# Patient Record
Sex: Female | Born: 1940 | Race: White | Hispanic: No | Smoking: Former smoker
Health system: Southern US, Community
[De-identification: ages and names within clinical notes are randomized; demographics above are authoritative.]

## PROBLEM LIST (undated history)

## (undated) DIAGNOSIS — C801 Malignant (primary) neoplasm, unspecified: Secondary | ICD-10-CM

## (undated) DIAGNOSIS — F329 Major depressive disorder, single episode, unspecified: Secondary | ICD-10-CM

## (undated) DIAGNOSIS — F32A Depression, unspecified: Secondary | ICD-10-CM

## (undated) DIAGNOSIS — M1990 Unspecified osteoarthritis, unspecified site: Secondary | ICD-10-CM

## (undated) DIAGNOSIS — R0600 Dyspnea, unspecified: Secondary | ICD-10-CM

## (undated) DIAGNOSIS — J449 Chronic obstructive pulmonary disease, unspecified: Secondary | ICD-10-CM

## (undated) DIAGNOSIS — G8929 Other chronic pain: Secondary | ICD-10-CM

## (undated) DIAGNOSIS — M549 Dorsalgia, unspecified: Secondary | ICD-10-CM

## (undated) HISTORY — PX: TONSILLECTOMY: SUR1361

## (undated) HISTORY — PX: LUNG CANCER SURGERY: SHX702

## (undated) HISTORY — PX: CARPAL TUNNEL RELEASE: SHX101

---

## 2000-03-05 ENCOUNTER — Emergency Department (HOSPITAL_COMMUNITY): Admission: EM | Admit: 2000-03-05 | Discharge: 2000-03-05 | Payer: Self-pay

## 2001-01-24 ENCOUNTER — Ambulatory Visit (HOSPITAL_COMMUNITY): Admission: RE | Admit: 2001-01-24 | Discharge: 2001-01-24 | Payer: Self-pay | Admitting: *Deleted

## 2001-01-24 ENCOUNTER — Encounter: Payer: Self-pay | Admitting: Internal Medicine

## 2001-02-11 ENCOUNTER — Ambulatory Visit (HOSPITAL_COMMUNITY): Admission: RE | Admit: 2001-02-11 | Discharge: 2001-02-11 | Payer: Self-pay | Admitting: Internal Medicine

## 2001-02-11 ENCOUNTER — Other Ambulatory Visit: Admission: RE | Admit: 2001-02-11 | Discharge: 2001-02-11 | Payer: Self-pay | Admitting: Internal Medicine

## 2001-02-11 ENCOUNTER — Encounter: Payer: Self-pay | Admitting: Internal Medicine

## 2001-05-14 ENCOUNTER — Encounter (INDEPENDENT_AMBULATORY_CARE_PROVIDER_SITE_OTHER): Payer: Self-pay | Admitting: Specialist

## 2001-05-14 ENCOUNTER — Ambulatory Visit (HOSPITAL_COMMUNITY): Admission: RE | Admit: 2001-05-14 | Discharge: 2001-05-14 | Payer: Self-pay | Admitting: *Deleted

## 2002-03-25 ENCOUNTER — Ambulatory Visit (HOSPITAL_COMMUNITY): Admission: RE | Admit: 2002-03-25 | Discharge: 2002-03-25 | Payer: Self-pay | Admitting: Internal Medicine

## 2002-03-25 ENCOUNTER — Encounter: Payer: Self-pay | Admitting: Internal Medicine

## 2002-12-04 ENCOUNTER — Encounter (INDEPENDENT_AMBULATORY_CARE_PROVIDER_SITE_OTHER): Payer: Self-pay | Admitting: *Deleted

## 2002-12-04 ENCOUNTER — Ambulatory Visit (HOSPITAL_COMMUNITY): Admission: RE | Admit: 2002-12-04 | Discharge: 2002-12-04 | Payer: Self-pay | Admitting: *Deleted

## 2003-03-13 ENCOUNTER — Emergency Department (HOSPITAL_COMMUNITY): Admission: EM | Admit: 2003-03-13 | Discharge: 2003-03-13 | Payer: Self-pay | Admitting: Emergency Medicine

## 2003-03-13 ENCOUNTER — Encounter: Payer: Self-pay | Admitting: Emergency Medicine

## 2003-03-29 ENCOUNTER — Ambulatory Visit (HOSPITAL_COMMUNITY): Admission: RE | Admit: 2003-03-29 | Discharge: 2003-03-29 | Payer: Self-pay | Admitting: Internal Medicine

## 2003-03-29 ENCOUNTER — Encounter: Payer: Self-pay | Admitting: Internal Medicine

## 2003-10-11 ENCOUNTER — Ambulatory Visit (HOSPITAL_COMMUNITY): Admission: RE | Admit: 2003-10-11 | Discharge: 2003-10-11 | Payer: Self-pay | Admitting: *Deleted

## 2004-03-30 ENCOUNTER — Ambulatory Visit (HOSPITAL_COMMUNITY): Admission: RE | Admit: 2004-03-30 | Discharge: 2004-03-30 | Payer: Self-pay | Admitting: Internal Medicine

## 2004-04-06 ENCOUNTER — Encounter: Admission: RE | Admit: 2004-04-06 | Discharge: 2004-04-06 | Payer: Self-pay | Admitting: Internal Medicine

## 2005-05-02 ENCOUNTER — Encounter: Admission: RE | Admit: 2005-05-02 | Discharge: 2005-05-02 | Payer: Self-pay | Admitting: Internal Medicine

## 2005-05-15 ENCOUNTER — Encounter: Admission: RE | Admit: 2005-05-15 | Discharge: 2005-05-15 | Payer: Self-pay | Admitting: Internal Medicine

## 2005-08-15 ENCOUNTER — Encounter: Admission: RE | Admit: 2005-08-15 | Discharge: 2005-08-15 | Payer: Self-pay | Admitting: Internal Medicine

## 2005-08-31 ENCOUNTER — Encounter: Admission: RE | Admit: 2005-08-31 | Discharge: 2005-08-31 | Payer: Self-pay | Admitting: Internal Medicine

## 2005-10-05 ENCOUNTER — Encounter: Admission: RE | Admit: 2005-10-05 | Discharge: 2005-10-05 | Payer: Self-pay | Admitting: Surgery

## 2005-10-08 ENCOUNTER — Encounter: Admission: RE | Admit: 2005-10-08 | Discharge: 2005-10-08 | Payer: Self-pay | Admitting: Surgery

## 2005-10-17 ENCOUNTER — Ambulatory Visit: Admission: RE | Admit: 2005-10-17 | Discharge: 2005-10-17 | Payer: Self-pay | Admitting: Thoracic Surgery

## 2005-10-17 ENCOUNTER — Ambulatory Visit (HOSPITAL_COMMUNITY): Admission: RE | Admit: 2005-10-17 | Discharge: 2005-10-17 | Payer: Self-pay | Admitting: Thoracic Surgery

## 2005-10-19 ENCOUNTER — Encounter (INDEPENDENT_AMBULATORY_CARE_PROVIDER_SITE_OTHER): Payer: Self-pay | Admitting: *Deleted

## 2005-10-19 ENCOUNTER — Inpatient Hospital Stay (HOSPITAL_COMMUNITY): Admission: RE | Admit: 2005-10-19 | Discharge: 2005-10-23 | Payer: Self-pay | Admitting: Thoracic Surgery

## 2005-10-28 ENCOUNTER — Emergency Department (HOSPITAL_COMMUNITY): Admission: EM | Admit: 2005-10-28 | Discharge: 2005-10-28 | Payer: Self-pay | Admitting: Emergency Medicine

## 2005-10-31 ENCOUNTER — Encounter: Admission: RE | Admit: 2005-10-31 | Discharge: 2005-10-31 | Payer: Self-pay | Admitting: Thoracic Surgery

## 2005-10-31 ENCOUNTER — Ambulatory Visit: Payer: Self-pay | Admitting: Internal Medicine

## 2005-11-14 ENCOUNTER — Encounter: Admission: RE | Admit: 2005-11-14 | Discharge: 2005-11-14 | Payer: Self-pay | Admitting: Thoracic Surgery

## 2005-11-29 ENCOUNTER — Encounter (INDEPENDENT_AMBULATORY_CARE_PROVIDER_SITE_OTHER): Payer: Self-pay | Admitting: Specialist

## 2005-11-29 ENCOUNTER — Ambulatory Visit (HOSPITAL_BASED_OUTPATIENT_CLINIC_OR_DEPARTMENT_OTHER): Admission: RE | Admit: 2005-11-29 | Discharge: 2005-11-29 | Payer: Self-pay | Admitting: Surgery

## 2005-12-26 ENCOUNTER — Encounter: Admission: RE | Admit: 2005-12-26 | Discharge: 2005-12-26 | Payer: Self-pay | Admitting: Thoracic Surgery

## 2006-02-21 ENCOUNTER — Ambulatory Visit: Payer: Self-pay | Admitting: Internal Medicine

## 2006-02-25 LAB — CBC WITH DIFFERENTIAL/PLATELET
Basophils Absolute: 0 10*3/uL (ref 0.0–0.1)
EOS%: 1.5 % (ref 0.0–7.0)
HCT: 39 % (ref 34.8–46.6)
HGB: 13.2 g/dL (ref 11.6–15.9)
LYMPH%: 21.7 % (ref 14.0–48.0)
MCH: 32.7 pg (ref 26.0–34.0)
MCV: 96.4 fL (ref 81.0–101.0)
MONO%: 7.6 % (ref 0.0–13.0)
NEUT%: 68.9 % (ref 39.6–76.8)
Platelets: 287 10*3/uL (ref 145–400)

## 2006-02-25 LAB — COMPREHENSIVE METABOLIC PANEL
AST: 23 U/L (ref 0–37)
Alkaline Phosphatase: 49 U/L (ref 39–117)
BUN: 14 mg/dL (ref 6–23)
Creatinine, Ser: 1.06 mg/dL (ref 0.40–1.20)
Glucose, Bld: 114 mg/dL — ABNORMAL HIGH (ref 70–99)

## 2006-02-27 ENCOUNTER — Ambulatory Visit (HOSPITAL_COMMUNITY): Admission: RE | Admit: 2006-02-27 | Discharge: 2006-02-27 | Payer: Self-pay | Admitting: Internal Medicine

## 2006-03-27 ENCOUNTER — Encounter: Admission: RE | Admit: 2006-03-27 | Discharge: 2006-03-27 | Payer: Self-pay | Admitting: Thoracic Surgery

## 2006-05-03 ENCOUNTER — Encounter: Admission: RE | Admit: 2006-05-03 | Discharge: 2006-05-03 | Payer: Self-pay | Admitting: Internal Medicine

## 2006-05-20 ENCOUNTER — Ambulatory Visit: Payer: Self-pay | Admitting: Internal Medicine

## 2006-05-20 LAB — CBC WITH DIFFERENTIAL/PLATELET
Basophils Absolute: 0 10*3/uL (ref 0.0–0.1)
EOS%: 1.4 % (ref 0.0–7.0)
HGB: 13.8 g/dL (ref 11.6–15.9)
LYMPH%: 16.3 % (ref 14.0–48.0)
MCH: 33.4 pg (ref 26.0–34.0)
MCV: 97.6 fL (ref 81.0–101.0)
MONO%: 8 % (ref 0.0–13.0)
RBC: 4.14 10*6/uL (ref 3.70–5.32)
RDW: 13.9 % (ref 11.3–14.5)

## 2006-05-20 LAB — COMPREHENSIVE METABOLIC PANEL
AST: 31 U/L (ref 0–37)
Albumin: 4.5 g/dL (ref 3.5–5.2)
Alkaline Phosphatase: 55 U/L (ref 39–117)
BUN: 12 mg/dL (ref 6–23)
Potassium: 3.5 mEq/L (ref 3.5–5.3)
Total Bilirubin: 1.7 mg/dL — ABNORMAL HIGH (ref 0.3–1.2)

## 2006-06-28 ENCOUNTER — Ambulatory Visit (HOSPITAL_COMMUNITY): Admission: RE | Admit: 2006-06-28 | Discharge: 2006-06-28 | Payer: Self-pay | Admitting: Internal Medicine

## 2006-08-28 ENCOUNTER — Encounter: Admission: RE | Admit: 2006-08-28 | Discharge: 2006-08-28 | Payer: Self-pay | Admitting: Thoracic Surgery

## 2006-12-18 ENCOUNTER — Ambulatory Visit: Payer: Self-pay | Admitting: Internal Medicine

## 2006-12-23 LAB — CBC WITH DIFFERENTIAL/PLATELET
Basophils Absolute: 0 10*3/uL (ref 0.0–0.1)
EOS%: 3 % (ref 0.0–7.0)
Eosinophils Absolute: 0.2 10*3/uL (ref 0.0–0.5)
HCT: 38.5 % (ref 34.8–46.6)
HGB: 13.3 g/dL (ref 11.6–15.9)
MCH: 33.3 pg (ref 26.0–34.0)
MCV: 96.5 fL (ref 81.0–101.0)
MONO%: 8.7 % (ref 0.0–13.0)
NEUT#: 3.3 10*3/uL (ref 1.5–6.5)
NEUT%: 59.6 % (ref 39.6–76.8)
lymph#: 1.6 10*3/uL (ref 0.9–3.3)

## 2006-12-23 LAB — COMPREHENSIVE METABOLIC PANEL
ALT: 17 U/L (ref 0–35)
Albumin: 4.1 g/dL (ref 3.5–5.2)
Alkaline Phosphatase: 47 U/L (ref 39–117)
CO2: 34 mEq/L — ABNORMAL HIGH (ref 19–32)
Glucose, Bld: 88 mg/dL (ref 70–99)
Potassium: 3.7 mEq/L (ref 3.5–5.3)
Sodium: 142 mEq/L (ref 135–145)
Total Protein: 6.8 g/dL (ref 6.0–8.3)

## 2006-12-27 ENCOUNTER — Ambulatory Visit (HOSPITAL_COMMUNITY): Admission: RE | Admit: 2006-12-27 | Discharge: 2006-12-27 | Payer: Self-pay | Admitting: Internal Medicine

## 2006-12-30 LAB — BASIC METABOLIC PANEL
BUN: 16 mg/dL (ref 6–23)
Calcium: 11.7 mg/dL — ABNORMAL HIGH (ref 8.4–10.5)
Glucose, Bld: 142 mg/dL — ABNORMAL HIGH (ref 70–99)
Potassium: 2.7 mEq/L — CL (ref 3.5–5.3)

## 2007-01-06 LAB — COMPREHENSIVE METABOLIC PANEL
ALT: 29 U/L (ref 0–35)
AST: 33 U/L (ref 0–37)
Albumin: 3.7 g/dL (ref 3.5–5.2)
Alkaline Phosphatase: 48 U/L (ref 39–117)
Glucose, Bld: 94 mg/dL (ref 70–99)
Potassium: 3.4 mEq/L — ABNORMAL LOW (ref 3.5–5.3)
Sodium: 140 mEq/L (ref 135–145)
Total Bilirubin: 1.2 mg/dL (ref 0.3–1.2)
Total Protein: 6.9 g/dL (ref 6.0–8.3)

## 2007-01-08 ENCOUNTER — Ambulatory Visit: Payer: Self-pay | Admitting: Thoracic Surgery

## 2007-05-09 ENCOUNTER — Encounter: Admission: RE | Admit: 2007-05-09 | Discharge: 2007-05-09 | Payer: Self-pay | Admitting: Internal Medicine

## 2007-06-19 ENCOUNTER — Ambulatory Visit: Payer: Self-pay | Admitting: Internal Medicine

## 2007-06-23 LAB — CBC WITH DIFFERENTIAL/PLATELET
BASO%: 1.3 % (ref 0.0–2.0)
EOS%: 4.4 % (ref 0.0–7.0)
LYMPH%: 30.5 % (ref 14.0–48.0)
MCH: 33.5 pg (ref 26.0–34.0)
MCHC: 35.7 g/dL (ref 32.0–36.0)
MCV: 93.8 fL (ref 81.0–101.0)
MONO%: 8.5 % (ref 0.0–13.0)
NEUT#: 2.7 10*3/uL (ref 1.5–6.5)
Platelets: 235 10*3/uL (ref 145–400)
RBC: 4.09 10*6/uL (ref 3.70–5.32)
RDW: 11.8 % (ref 11.3–14.5)

## 2007-06-23 LAB — COMPREHENSIVE METABOLIC PANEL
AST: 29 U/L (ref 0–37)
Albumin: 4.6 g/dL (ref 3.5–5.2)
Alkaline Phosphatase: 46 U/L (ref 39–117)
Glucose, Bld: 78 mg/dL (ref 70–99)
Potassium: 3.4 mEq/L — ABNORMAL LOW (ref 3.5–5.3)
Sodium: 144 mEq/L (ref 135–145)
Total Bilirubin: 1.1 mg/dL (ref 0.3–1.2)
Total Protein: 7.4 g/dL (ref 6.0–8.3)

## 2007-06-27 ENCOUNTER — Ambulatory Visit (HOSPITAL_COMMUNITY): Admission: RE | Admit: 2007-06-27 | Discharge: 2007-06-27 | Payer: Self-pay | Admitting: Internal Medicine

## 2007-12-18 ENCOUNTER — Ambulatory Visit: Payer: Self-pay | Admitting: Internal Medicine

## 2007-12-23 LAB — COMPREHENSIVE METABOLIC PANEL
ALT: 23 U/L (ref 0–35)
AST: 30 U/L (ref 0–37)
CO2: 28 mEq/L (ref 19–32)
Calcium: 9.2 mg/dL (ref 8.4–10.5)
Chloride: 99 mEq/L (ref 96–112)
Creatinine, Ser: 1.2 mg/dL (ref 0.40–1.20)
Sodium: 139 mEq/L (ref 135–145)
Total Protein: 7.4 g/dL (ref 6.0–8.3)

## 2007-12-23 LAB — CBC WITH DIFFERENTIAL/PLATELET
BASO%: 0.4 % (ref 0.0–2.0)
EOS%: 1.9 % (ref 0.0–7.0)
MCH: 32.4 pg (ref 26.0–34.0)
MCHC: 34.4 g/dL (ref 32.0–36.0)
MONO#: 0.4 10*3/uL (ref 0.1–0.9)
NEUT%: 65.5 % (ref 39.6–76.8)
RBC: 4.23 10*6/uL (ref 3.70–5.32)
RDW: 16.6 % — ABNORMAL HIGH (ref 11.3–14.5)
WBC: 6 10*3/uL (ref 3.9–10.0)
lymph#: 1.5 10*3/uL (ref 0.9–3.3)

## 2007-12-26 ENCOUNTER — Ambulatory Visit (HOSPITAL_COMMUNITY): Admission: RE | Admit: 2007-12-26 | Discharge: 2007-12-26 | Payer: Self-pay | Admitting: Internal Medicine

## 2008-05-14 ENCOUNTER — Encounter: Admission: RE | Admit: 2008-05-14 | Discharge: 2008-05-14 | Payer: Self-pay | Admitting: Internal Medicine

## 2008-06-21 ENCOUNTER — Ambulatory Visit: Payer: Self-pay | Admitting: Internal Medicine

## 2008-06-23 LAB — COMPREHENSIVE METABOLIC PANEL
ALT: 46 U/L — ABNORMAL HIGH (ref 0–35)
AST: 74 U/L — ABNORMAL HIGH (ref 0–37)
Albumin: 4.6 g/dL (ref 3.5–5.2)
CO2: 25 mEq/L (ref 19–32)
Calcium: 9.2 mg/dL (ref 8.4–10.5)
Chloride: 96 mEq/L (ref 96–112)
Creatinine, Ser: 1.18 mg/dL (ref 0.40–1.20)
Potassium: 3.6 mEq/L (ref 3.5–5.3)
Total Protein: 7.5 g/dL (ref 6.0–8.3)

## 2008-06-23 LAB — CBC WITH DIFFERENTIAL/PLATELET
BASO%: 0.2 % (ref 0.0–2.0)
EOS%: 1.2 % (ref 0.0–7.0)
HCT: 43.4 % (ref 34.8–46.6)
HGB: 14.9 g/dL (ref 11.6–15.9)
MCHC: 34.4 g/dL (ref 32.0–36.0)
MONO#: 0.3 10*3/uL (ref 0.1–0.9)
NEUT%: 75.4 % (ref 39.6–76.8)
RDW: 15.2 % — ABNORMAL HIGH (ref 11.3–14.5)
WBC: 6.3 10*3/uL (ref 3.9–10.0)
lymph#: 1.1 10*3/uL (ref 0.9–3.3)

## 2008-06-25 ENCOUNTER — Ambulatory Visit (HOSPITAL_COMMUNITY): Admission: RE | Admit: 2008-06-25 | Discharge: 2008-06-25 | Payer: Self-pay | Admitting: Internal Medicine

## 2008-07-06 ENCOUNTER — Ambulatory Visit (HOSPITAL_COMMUNITY): Admission: RE | Admit: 2008-07-06 | Discharge: 2008-07-06 | Payer: Self-pay | Admitting: Neurosurgery

## 2008-07-22 ENCOUNTER — Ambulatory Visit (HOSPITAL_COMMUNITY): Admission: RE | Admit: 2008-07-22 | Discharge: 2008-07-22 | Payer: Self-pay | Admitting: *Deleted

## 2008-08-12 ENCOUNTER — Encounter: Admission: RE | Admit: 2008-08-12 | Discharge: 2008-08-12 | Payer: Self-pay | Admitting: Internal Medicine

## 2008-08-27 ENCOUNTER — Encounter: Admission: RE | Admit: 2008-08-27 | Discharge: 2008-08-27 | Payer: Self-pay | Admitting: Internal Medicine

## 2008-10-21 ENCOUNTER — Inpatient Hospital Stay (HOSPITAL_COMMUNITY): Admission: RE | Admit: 2008-10-21 | Discharge: 2008-10-26 | Payer: Self-pay | Admitting: Neurosurgery

## 2008-12-29 ENCOUNTER — Ambulatory Visit: Payer: Self-pay | Admitting: Internal Medicine

## 2008-12-31 ENCOUNTER — Ambulatory Visit (HOSPITAL_COMMUNITY): Admission: RE | Admit: 2008-12-31 | Discharge: 2008-12-31 | Payer: Self-pay | Admitting: Internal Medicine

## 2008-12-31 LAB — CBC WITH DIFFERENTIAL/PLATELET
Basophils Absolute: 0 10*3/uL (ref 0.0–0.1)
HCT: 40 % (ref 34.8–46.6)
HGB: 13.5 g/dL (ref 11.6–15.9)
MONO#: 0.3 10*3/uL (ref 0.1–0.9)
NEUT#: 1.4 10*3/uL — ABNORMAL LOW (ref 1.5–6.5)
NEUT%: 45.8 % (ref 38.4–76.8)
WBC: 3 10*3/uL — ABNORMAL LOW (ref 3.9–10.3)
lymph#: 1.1 10*3/uL (ref 0.9–3.3)

## 2008-12-31 LAB — COMPREHENSIVE METABOLIC PANEL
ALT: 21 U/L (ref 0–35)
Albumin: 3.7 g/dL (ref 3.5–5.2)
BUN: 8 mg/dL (ref 6–23)
CO2: 31 mEq/L (ref 19–32)
Calcium: 9.4 mg/dL (ref 8.4–10.5)
Chloride: 100 mEq/L (ref 96–112)
Creatinine, Ser: 0.76 mg/dL (ref 0.40–1.20)
Potassium: 3.2 mEq/L — ABNORMAL LOW (ref 3.5–5.3)

## 2009-05-18 ENCOUNTER — Encounter: Admission: RE | Admit: 2009-05-18 | Discharge: 2009-05-18 | Payer: Self-pay | Admitting: Internal Medicine

## 2009-06-29 ENCOUNTER — Ambulatory Visit: Payer: Self-pay | Admitting: Internal Medicine

## 2009-07-01 ENCOUNTER — Ambulatory Visit (HOSPITAL_COMMUNITY): Admission: RE | Admit: 2009-07-01 | Discharge: 2009-07-01 | Payer: Self-pay | Admitting: Internal Medicine

## 2009-07-01 ENCOUNTER — Encounter: Admission: RE | Admit: 2009-07-01 | Discharge: 2009-08-01 | Payer: Self-pay | Admitting: Internal Medicine

## 2009-07-01 LAB — CBC WITH DIFFERENTIAL/PLATELET
Basophils Absolute: 0 10*3/uL (ref 0.0–0.1)
EOS%: 2.9 % (ref 0.0–7.0)
Eosinophils Absolute: 0.1 10*3/uL (ref 0.0–0.5)
HCT: 43.2 % (ref 34.8–46.6)
HGB: 14.5 g/dL (ref 11.6–15.9)
MCH: 35.4 pg — ABNORMAL HIGH (ref 25.1–34.0)
MONO#: 0.4 10*3/uL (ref 0.1–0.9)
NEUT%: 59.9 % (ref 38.4–76.8)
lymph#: 1 10*3/uL (ref 0.9–3.3)

## 2009-07-01 LAB — COMPREHENSIVE METABOLIC PANEL
BUN: 6 mg/dL (ref 6–23)
CO2: 31 mEq/L (ref 19–32)
Calcium: 9.5 mg/dL (ref 8.4–10.5)
Chloride: 97 mEq/L (ref 96–112)
Creatinine, Ser: 0.9 mg/dL (ref 0.40–1.20)
Glucose, Bld: 92 mg/dL (ref 70–99)

## 2009-07-29 ENCOUNTER — Encounter (INDEPENDENT_AMBULATORY_CARE_PROVIDER_SITE_OTHER): Payer: Self-pay | Admitting: *Deleted

## 2009-12-28 ENCOUNTER — Other Ambulatory Visit: Admission: RE | Admit: 2009-12-28 | Discharge: 2009-12-28 | Payer: Self-pay | Admitting: Internal Medicine

## 2009-12-29 ENCOUNTER — Ambulatory Visit: Payer: Self-pay | Admitting: Internal Medicine

## 2009-12-30 ENCOUNTER — Ambulatory Visit (HOSPITAL_COMMUNITY): Admission: RE | Admit: 2009-12-30 | Discharge: 2009-12-30 | Payer: Self-pay | Admitting: Internal Medicine

## 2009-12-30 LAB — COMPREHENSIVE METABOLIC PANEL
ALT: 21 U/L (ref 0–35)
Alkaline Phosphatase: 50 U/L (ref 39–117)
BUN: 8 mg/dL (ref 6–23)
CO2: 31 mEq/L (ref 19–32)
Glucose, Bld: 82 mg/dL (ref 70–99)
Sodium: 140 mEq/L (ref 135–145)

## 2009-12-30 LAB — CBC WITH DIFFERENTIAL/PLATELET
BASO%: 1.1 % (ref 0.0–2.0)
Basophils Absolute: 0 10*3/uL (ref 0.0–0.1)
EOS%: 3.8 % (ref 0.0–7.0)
Eosinophils Absolute: 0.1 10*3/uL (ref 0.0–0.5)
HGB: 13.4 g/dL (ref 11.6–15.9)
MCH: 33.6 pg (ref 25.1–34.0)
MCHC: 34 g/dL (ref 31.5–36.0)
MCV: 98.8 fL (ref 79.5–101.0)
NEUT#: 1.7 10*3/uL (ref 1.5–6.5)
Platelets: 244 10*3/uL (ref 145–400)

## 2010-05-19 ENCOUNTER — Encounter: Admission: RE | Admit: 2010-05-19 | Discharge: 2010-05-19 | Payer: Self-pay | Admitting: Internal Medicine

## 2010-06-26 ENCOUNTER — Ambulatory Visit: Payer: Self-pay | Admitting: Internal Medicine

## 2010-06-28 ENCOUNTER — Ambulatory Visit (HOSPITAL_COMMUNITY): Admission: RE | Admit: 2010-06-28 | Discharge: 2010-06-28 | Payer: Self-pay | Admitting: Internal Medicine

## 2010-06-28 LAB — CBC WITH DIFFERENTIAL/PLATELET
EOS%: 5.1 % (ref 0.0–7.0)
LYMPH%: 30.7 % (ref 14.0–49.7)
NEUT#: 1.9 10*3/uL (ref 1.5–6.5)
NEUT%: 51.4 % (ref 38.4–76.8)
Platelets: 236 10*3/uL (ref 145–400)
RBC: 4.11 10*6/uL (ref 3.70–5.45)
RDW: 14.9 % — ABNORMAL HIGH (ref 11.2–14.5)
lymph#: 1.1 10*3/uL (ref 0.9–3.3)

## 2010-06-28 LAB — COMPREHENSIVE METABOLIC PANEL
AST: 60 U/L — ABNORMAL HIGH (ref 0–37)
CO2: 30 mEq/L (ref 19–32)
Chloride: 99 mEq/L (ref 96–112)
Potassium: 3.6 mEq/L (ref 3.5–5.3)
Sodium: 140 mEq/L (ref 135–145)
Total Protein: 7.4 g/dL (ref 6.0–8.3)

## 2010-09-29 ENCOUNTER — Other Ambulatory Visit: Payer: Self-pay | Admitting: Internal Medicine

## 2010-09-29 DIAGNOSIS — C3490 Malignant neoplasm of unspecified part of unspecified bronchus or lung: Secondary | ICD-10-CM

## 2010-10-01 ENCOUNTER — Encounter: Payer: Self-pay | Admitting: Internal Medicine

## 2010-12-26 LAB — BASIC METABOLIC PANEL
CO2: 27 mEq/L (ref 19–32)
CO2: 31 mEq/L (ref 19–32)
Chloride: 95 mEq/L — ABNORMAL LOW (ref 96–112)
Chloride: 95 mEq/L — ABNORMAL LOW (ref 96–112)
Chloride: 98 mEq/L (ref 96–112)
Creatinine, Ser: 0.75 mg/dL (ref 0.4–1.2)
Creatinine, Ser: 0.84 mg/dL (ref 0.4–1.2)
GFR calc Af Amer: >60 mL/min (ref >60)
GFR calc Af Amer: >60 mL/min (ref >60)
GFR calc non Af Amer: 57 mL/min — ABNORMAL LOW (ref >60)
Glucose, Bld: 127 mg/dL — ABNORMAL HIGH (ref 70–99)
Potassium: 3.1 mEq/L — ABNORMAL LOW (ref 3.5–5.1)
Potassium: 3.4 mEq/L — ABNORMAL LOW (ref 3.5–5.1)
Sodium: 135 mEq/L (ref 135–145)
Sodium: 136 mEq/L (ref 135–145)

## 2010-12-26 LAB — CBC
HCT: 43.3 % (ref 36.0–46.0)
Hemoglobin: 10.6 g/dL — ABNORMAL LOW (ref 12.0–15.0)
Hemoglobin: 14.9 g/dL (ref 12.0–15.0)
MCV: 105.9 fL — ABNORMAL HIGH (ref 78.0–100.0)
MCV: 107 fL — ABNORMAL HIGH (ref 78.0–100.0)
RBC: 2.87 MIL/uL — ABNORMAL LOW (ref 3.87–5.11)
RBC: 4.08 MIL/uL (ref 3.87–5.11)
WBC: 5.1 10*3/uL (ref 4.0–10.5)
WBC: 5.7 10*3/uL (ref 4.0–10.5)

## 2010-12-26 LAB — TYPE AND SCREEN

## 2010-12-29 ENCOUNTER — Other Ambulatory Visit: Payer: Self-pay | Admitting: Internal Medicine

## 2010-12-29 ENCOUNTER — Encounter (HOSPITAL_COMMUNITY): Payer: Self-pay

## 2010-12-29 ENCOUNTER — Ambulatory Visit (HOSPITAL_COMMUNITY)
Admission: RE | Admit: 2010-12-29 | Discharge: 2010-12-29 | Disposition: A | Discharge status: Home / Self Care | Payer: Medicare Other | Source: Ambulatory Visit | Attending: Internal Medicine | Admitting: Internal Medicine

## 2010-12-29 ENCOUNTER — Encounter (HOSPITAL_BASED_OUTPATIENT_CLINIC_OR_DEPARTMENT_OTHER): Payer: Medicare Other | Admitting: Internal Medicine

## 2010-12-29 DIAGNOSIS — Z09 Encounter for follow-up examination after completed treatment for conditions other than malignant neoplasm: Secondary | ICD-10-CM | POA: Insufficient documentation

## 2010-12-29 DIAGNOSIS — C3490 Malignant neoplasm of unspecified part of unspecified bronchus or lung: Secondary | ICD-10-CM

## 2010-12-29 DIAGNOSIS — Z85118 Personal history of other malignant neoplasm of bronchus and lung: Secondary | ICD-10-CM | POA: Insufficient documentation

## 2010-12-29 DIAGNOSIS — C3430 Malignant neoplasm of lower lobe, unspecified bronchus or lung: Secondary | ICD-10-CM

## 2010-12-29 HISTORY — DX: Malignant (primary) neoplasm, unspecified: C80.1

## 2010-12-29 LAB — CBC WITH DIFFERENTIAL/PLATELET
Basophils Absolute: 0 10*3/uL (ref 0.0–0.1)
Eosinophils Absolute: 0.2 10*3/uL (ref 0.0–0.5)
HGB: 13.7 g/dL (ref 11.6–15.9)
LYMPH%: 37.5 % (ref 14.0–49.7)
MCH: 34.2 pg — ABNORMAL HIGH (ref 25.1–34.0)
MCV: 101 fL (ref 79.5–101.0)
MONO%: 9.6 % (ref 0.0–14.0)
NEUT#: 1.6 10*3/uL (ref 1.5–6.5)
Platelets: 196 10*3/uL (ref 145–400)
RBC: 4.01 10*6/uL (ref 3.70–5.45)

## 2010-12-29 LAB — CMP (CANCER CENTER ONLY)
CO2: 31 mEq/L (ref 18–33)
Creat: 1 mg/dl (ref 0.6–1.2)
Glucose, Bld: 84 mg/dL (ref 73–118)
Total Bilirubin: 1 mg/dl (ref 0.20–1.60)

## 2010-12-29 MED ORDER — IOHEXOL 300 MG/ML  SOLN
80.0000 mL | Freq: Once | INTRAMUSCULAR | Status: AC | PRN
  Administered 2010-12-29: 80 mL via INTRAVENOUS

## 2011-01-08 ENCOUNTER — Other Ambulatory Visit: Payer: Self-pay | Admitting: Internal Medicine

## 2011-01-08 ENCOUNTER — Encounter (HOSPITAL_BASED_OUTPATIENT_CLINIC_OR_DEPARTMENT_OTHER): Payer: Medicare Other | Admitting: Internal Medicine

## 2011-01-08 DIAGNOSIS — C3430 Malignant neoplasm of lower lobe, unspecified bronchus or lung: Secondary | ICD-10-CM

## 2011-01-08 DIAGNOSIS — C3490 Malignant neoplasm of unspecified part of unspecified bronchus or lung: Secondary | ICD-10-CM

## 2011-01-23 NOTE — Op Note (Signed)
[name], [name]              ACCOUNT NO.:  [id number]   MEDICAL RECORD NO.:  [id number]          PATIENT TYPE:  AMB   LOCATION:  ENDO                         FACILITY:  [facility]   PHYSICIAN:  Georgiana Spinner, M.D.    DATE OF BIRTH:  October 22, 1940   DATE OF PROCEDURE:  07/22/2008  DATE OF DISCHARGE:                               OPERATIVE REPORT   PROCEDURE:  Colonoscopy.   INDICATIONS:  Colon cancer screening.   ANESTHESIA:  Fentanyl 100 mcg, Versed 10 mg.   PROCEDURE:  With the patient mildly sedated in the left lateral  decubitus position, the Pentax videoscopic colonoscope was inserted in  the rectum and passed under direct vision to the right colon.  It was  very tortuous and despite turning the patient to her back, her right  side with pressure applied, returning her, we could not reach the cecum  so I elected therefore to terminate the procedure.  From this point the  colonoscope was slowly withdrawn taking circumferential views of colonic  mucosa stopping only in the rectum which appeared normal on direct and  showed hemorrhoids on retroflexed view.  The endoscope was straightened  and withdrawn.  The patient's vital signs, pulse oximeter remained  stable.  The patient tolerated procedure well without apparent  complications.   FINDINGS:  Very tortuous examination of colon but no abnormalities noted  other than internal hemorrhoids at this point.   PLAN:  Will proceed with barium enema to view remainder of colon.           ______________________________  Georgiana Spinner, M.D.     GMO/MEDQ  D:  07/22/2008  T:  07/22/2008  Job:  [id number]

## 2011-01-23 NOTE — Op Note (Signed)
[name], [name]              ACCOUNT NO.:  [id number]   MEDICAL RECORD NO.:  [id number]          PATIENT TYPE:  INP   LOCATION:  3015                         FACILITY:  [facility]   PHYSICIAN:  Cristi Loron, M.D.DATE OF BIRTH:  May 13, 1941   DATE OF PROCEDURE:  10/21/2008  DATE OF DISCHARGE:                               OPERATIVE REPORT   BRIEF HISTORY:  The patient is a 70 year old white female who has  suffered from back and leg pain consistent with neurogenic claudication.  She is worked up with a lumbar MRI and lumbar x-rays which demonstrated  that the patient had spinal stenosis, scoliosis, spondylolisthesis, etc.  I discussed the various treatment options with the patient and her  family including surgery.  The patient weighed the risks, benefits and  alternatives of surgery.  She decided to proceed with a L3-4 and L4-5  decompression, instrumentation and fusion.   PREOPERATIVE DIAGNOSIS:  L3-4 and L4-5 degenerative disk disease,  spondylolisthesis, spinal stenosis, scoliosis, lumbago, and lumbar  radiculopathy.   POSTOPERATIVE DIAGNOSIS:  L3-4 and L4-5 degenerative disk disease,  spondylolisthesis, spinal stenosis, scoliosis, lumbago, and lumbar  radiculopathy.   PROCEDURE:  Bilateral L3 and L4 laminotomies and foraminotomies to  decompress the bilateral L3, L4 as well as L5 nerve roots; L3-4 and L4-5  posterior lumbar interbody fusion with local morselized autograft bone  and Actifuse/Vitoss bone graft extender; insertion of L3-4 and L4-5  interbody prosthesis (Capstone PEEK interbody prosthesis); L3-L5  posterior segmental instrumentation with Legacy titanium plate, screws  and rods; L3-4 and L4-5 posterolateral arthrodesis with local morselized  autograft bone, Vitoss and Actifuse bone graft extenders.   SURGEON:  Cristi Loron, MD   ASSISTANT:  Clydene Fake, MD   ANESTHESIA:  General endotracheal.   ESTIMATED BLOOD LOSS:  300 mL.   SPECIMENS:   None.   DRAINS:  None.   COMPLICATIONS:  None.   DESCRIPTION OF PROCEDURE:  The patient was brought to the operating room  by the Anesthesia Team.  General endotracheal anesthesia was induced.  The patient was carefully turned to the prone position on the Wilson  frame.  The lumbosacral region was then prepared with Betadine scrub and  Betadine solution.  Sterile drapes were applied.  I then injected the  area to be incised with Marcaine with epinephrine solution.  I used a  scalpel to make a linear midline incision over the L3-4 and L4-5  interspaces.  I used electrocautery to perform a bilateral subperiosteal  dissection exposing the spinous process lamina of L2, L3, L4, and L5..  We then obtained intraoperative radiograph to confirm our location and  then inserted the Versa-Trac retractor for exposure.   Because of the patient's scoliosis, spondylolisthesis, severe stenosis  and facet arthropathy etc., a wide decompression was required to  decompress the nerve roots, i.e. in excess of what was required to do a  posterior lumbar interbody fusion.  We used a high-speed drill to  perform bilateral L3 and L4 laminotomies.  I widened these laminotomies  with Kerrison punch removing the L3-4 and L4-5 ligamentum flavum.  We  removed the medial aspect of the facets in fact removed the inferior  facet at L3 bilaterally.  We performed wide foraminotomies about the  bilateral L3, L4 and L5 nerve roots and this completed the  decompression.  Of note, we did encounter a synovial cyst at L4-5 on the  left which was quite adherent to the patient's dura.  We removed the  cyst in multiple fragments using pituitary forceps, then we did create a  durotomy.  We did not have any CSF leakage but clearly we could see the  arachnoid.  I then repaired this with a running 6-0 Prolene suture.   Having completed the decompression, we now turned our attention to  arthrodesis.  We incised the L3-4 and L4-5  intervertebral disk with a 15  blade scalpel bilaterally and performed a partial intervertebral  diskectomy with a pituitary forceps.  We then prepared the vertebral  endplates for fusion by removing the soft tissues using curettes.  We  then used trial spacers and determined to use a 10 x 26 mm Capstone PEEK  interbody prosthesis bilaterally at both levels.  We prefilled this  prosthesis with combination of local morselized autograft bone that we  obtained during the decompression as well as Actifuse and Vitoss bone  graft extenders.  We inserted the prosthesis into the interspaces  bilaterally of course after retracting the neural structures out of  harm's way.  There was good snug fit of prosthesis bilaterally at each  level.  We also filled the remainder of the clear disk space with a  combination of Vitoss, Actifuse and local autograft bone.  This  completed the posterior lumbar interbody fusion.   We now turned our attention to the instrumentation.  Under fluoroscopic  guidance, we cannulated the bilateral L3, L4 and L5  pedicles with a  bone probe.  We tapped the pedicles with a 5.5-mm tap and then probed  inside the tapped pedicles to rule out cortical breeches.  We then  inserted a 6.5 x 15 mm pedicle screws bilaterally at L3, L4 and L5 under  fluoroscopic guidance.  We palpated along the medial aspect of bilateral  L3, L4 and L5 pedicles and noted there was no cortical breeches.  We  then connected unilateral pedicle screws with a lordotic rod which was  secured in place with the caps which we tightened appropriately.  This  completed the instrumentation.   We now turned our attention to the posterolateral arthrodesis.  We used  high-speed drill to decorticate the remainder of the L3-4, L4-5 facets,  pars and transverse processes.  We made a combination of local  morselized autograft bone and Actifuse/Vitoss bone graft extenders over  these decorticated posterolateral  structures.  This completed the  posterolateral arthrodesis.   We then obtained hemostasis using bipolar electrocautery.  We irrigated  the wound out with bacitracin solution.  We palpated along the ventral  thecal sac at L3-4, and L4-5 and along the exit route of the bilateral  L3, L4 and L5 nerve roots and noted they are all well decompressed.  We  got some DuraSeal to repair the durotomy and then removed the retractors  and reapproximated the patient's thoracolumbar fascia with interrupted  #1 Vicryl suture, the subcutaneous tissue with interrupted 2-0 Vicryl  suture and the skin with Steri-Strips and benzoin.  The wound was then  coated with bacitracin ointment and sterile dressing was applied.  The  drapes were removed and the patient was subsequently returned  to the  supine position where she was extubated by the Anesthesia Team and  transported to the postanesthesia care unit in stable condition.  All  sponge, instrument and needle counts were correct at the end of this  case.     Cristi Loron, M.D.  Electronically Signed    JDJ/MEDQ  D:  10/21/2008  T:  10/22/2008  Job:  [id number]

## 2011-01-26 NOTE — Op Note (Signed)
NAMEJOLINDA, PINKSTAFF              ACCOUNT NO.:  [id number]   MEDICAL RECORD NO.:  [id number]          PATIENT TYPE:  INP   LOCATION:  2550                         FACILITY:  [facility]   PHYSICIAN:  Ines Bloomer, M.D. DATE OF BIRTH:  1940/12/06   DATE OF PROCEDURE:  10/19/2005  DATE OF DISCHARGE:                                 OPERATIVE REPORT   PREOPERATIVE DIAGNOSIS:  Left lower lobe mass.   POSTOPERATIVE DIAGNOSIS:  Non-small cell cancer left lower lobe.   OPERATION PERFORMED:  Left video assisted thoracoscopic surgery, left  thoracotomy, left superior segmentectomy, completion left lower lobectomy  with node dissection.   SURGEON:  Ines Bloomer, M.D.   FIRST ASSISTANT:  Coral Ceo, P.A.-C.   This 70 year old patient had a right upper lobectomy for non-small cell lung  cancer in 1999 and now presented for a left lower lobe VATS.  She had been  seen for a nipple discharge by Dr. Jamey Ripa and on chest x-ray was found to  have the left lower lobe mass.  She is brought to the operating room for  excision.  We were hoping to do a left lower lobe superior segmentectomy in  order to preserve as much lung function as possible.   After general anesthesia, the patient was turned to the left lateral  thoracotomy position, was prepped and draped in the usual sterile manner.  A  trocar site was made in the sixth intercostal space at the anterior axillary  line and at the seventh intercostal space at the mid axillary line.  Two  trocars were inserted.  The 0 degree scope was inserted.  The patient had  evidence of chronic obstructive pulmonary disease, no pleural metastases.  A  posterolateral thoracotomy was made in the fifth intercostal space,  partially dividing the latissimus, reflecting the serratus anteriorly, and  entering the fifth intercostal space.  A portion of the sixth rib was taken  subperiosteally at the angles.  Two Tuffier's were placed at right angles.  Attention  was started in the fissure and dissection was carried down through  the fissure to the pulmonary artery and the pulmonary artery resected out  and the superior portion of the fissure was divided with an auto-suture,  green Roticulator.  The posterior mediastinum was dissected free.  We then  identified the superior segmental artery and it was stapled with an  Autosuture 30 white Roticulator.  The inferior pulmonary ligament was  dissected down and the superior segmental branch of the inferior pulmonary  vein was doubly ligated with 0 silk, clipped, and divided.  Then, the  bronchus to the superior segment was dissected out and divided with an  Autosuture green 45 stapler.  We then used the Autosuture 60 green stapler  to resect the superior segment.  We thought had 1 cm margins around the  lesion which was 2 to 2.5 cm in size.  However, on frozen section, it was a  non-small cell lung cancer, but unfortunately, the margin was positive.  We  elected to do a completion pneumonectomy.  The rest of the inferior  pulmonary  vein was divided with an Autosuture 30 white Roticulator stapler.  The basilar branches of the pulmonary artery was divided with the Autosuture  30 white stapler.  The inferior portion of the fissure was divided with the  Autosuture 45 stapler with two applications.  Several 10L nodes were taken  as well as 11L nodes were taken for pathological examination.  All obvious  nodes were removed.  Then, the bronchus was divided with the Autosuture 45  and the rest of the of the right lower lobe was removed.  Two chest tubes  were brought in through the trocar sites and tied in place with 0 silk.  A  Marcaine block was done in the usual fashion.  The Marcaine On-Q catheter  was inserted subpleural in the usual fashion and filled with Marcaine.  CoSeal was applied to the staple line.  The intercostal muscle flap was  taken down with electrocautery and sutured to the  bronchial stump  with interrupted 2-0 silk.  The anterior chest tube was a  straight one and the posterior one was a right angle one.  Then, the chest  was closed with two pericostals, #1 Vicryl in the muscle layer, 2-0 Vicryl  in the subcutaneous tissue, and Dermabond for the skin.  The patient was  returned to the recovery room in stable condition.           ______________________________  Ines Bloomer, M.D.     DPB/MEDQ  D:  10/19/2005  T:  10/19/2005  Job:  [id number]   cc:   Currie Paris, M.D.  [address], [address]  [place]  [place] [address]   Georgianne Fick, M.D.  Fax: [phone]

## 2011-01-26 NOTE — Op Note (Signed)
[name], [name]              ACCOUNT NO.:  [id number]   MEDICAL RECORD NO.:  [id number]          PATIENT TYPE:  AMB   LOCATION:  DSC                          FACILITY:  [facility]   PHYSICIAN:  Currie Paris, M.D.DATE OF BIRTH:  05/11/1941   DATE OF PROCEDURE:  11/29/2005  DATE OF DISCHARGE:                                 OPERATIVE REPORT   OFFICE MEDICAL RECORD NUMBER:  [id number].   PREOPERATIVE DIAGNOSIS:  Left breast nipple discharge.   POSTOPERATIVE DIAGNOSIS:  Left breast nipple discharge.   OPERATION:  Ductal excision, left breast.   SURGEON:  Currie Paris, M.D.   ANESTHESIA:  General.   CLINICAL HISTORY:  This is a 64-year lady with a spontaneous left nipple  discharge.  A ductogram had showed a filling defect.  She was originally  scheduled for surgery, but this was postponed when her preoperative chest x-  ray showed what turned out to be a new lung cancer, which was operated on by  Dr. Edwyna Shell.  She has now recovered from that surgery and is scheduled for  excision of the left ductal tissue.   DESCRIPTION OF PROCEDURE:  The patient was seen in the holding area, and she  had no further questions.  I reviewed the films.  The left breast was marked  by the patient and myself as the operative side.   She was then taken to the operating room and after satisfactory general  anesthesia had been obtained, the left breast was prepped and draped.  The  time-out occurred.   I made a curvilinear incision at the areolar margin.  Prior to doing that I  probed the duct with a tear duct probe and then injected a little methylene  blue and then replaced the tear duct probe.  After incision was made, I  elevated the skin towards the duct and identified the tear duct probe in the  duct and the approximate direction travel of the tear duct probe into the  breast tissue.  The duct was then divided at the dermal aspect of the nipple  and the tissue around the duct excised  with cautery.  Once this was done, I  sent this to pathology.  The breast was checked for hemostasis and once  everything was dry, I infiltrated some 0.25% plain Marcaine.  I palpated  carefully to make there were sure no additional lesions noted.   The incision was then closed with 3-0 Vicryl, 4-0 Monocryl subcuticular, and  Dermabond on the skin.   The patient tolerated the procedure well.  There were no operative  complications.  All counts were correct.      Currie Paris, M.D.  Electronically Signed     CJS/MEDQ  D:  11/29/2005  T:  11/30/2005  Job:  [id number]   cc:   Georgianne Fick, M.D.  Fax: [phone]   Lajuana Matte, MD  Fax: [phone]

## 2011-01-26 NOTE — Discharge Summary (Signed)
[name], [name]              ACCOUNT NO.:  [id number]   MEDICAL RECORD NO.:  [id number]          PATIENT TYPE:  INP   LOCATION:  3015                         FACILITY:  [facility]   PHYSICIAN:  Cristi Loron, M.D.DATE OF BIRTH:  1941-03-18   DATE OF ADMISSION:  10/21/2008  DATE OF DISCHARGE:  10/26/2008                               DISCHARGE SUMMARY   BRIEF HISTORY:  The patient is a 70 year old white female who suffered  from back and leg pain consistent with neurogenic claudication.  She  was worked up with a lumbar MRI and lumbar x-rays which demonstrated  that the patient has spinal stenosis, scoliosis, spondylosis etc.  I  discussed the various treatment options with the patient and her family  including surgery.  The patient has weighed the risks, benefits, and  alternatives of surgery and decided to proceed with an L3-4 and L4-5  decompression, instrumentation and fusion.   For further details of this admission, please refer to typed history and  physical.   HOSPITAL COURSE:  I admitted the patient to [facility] on  October 21, 2008.  On the day of admission, I performed L3-4 and L4-5  decompression, instrumentation and fusion.  The surgery went well (for  details of this operation please refer to the typed operative note).   POSTOPERATIVE COURSE:  The patient's postoperative course was  unremarkable.  She was discharged to home on October 26, 2008.   DISCHARGE PRESCRIPTIONS:  The patient was given discharge prescriptions  for Percocet 10/325 #100 one p.o. q.4 h. p.r.n. pain, Valium 5 mg #50  one p.o. q.8 hours p.r.n. muscle spasm.   FINAL DIAGNOSES:  L3-4 and L4-5 degenerative disk disease, spinal  stenosis, spondylolisthesis, lumbago, lumbar radiculopathy, and  scoliosis.   PROCEDURE PERFORMED:  Bilateral L3 and L4 laminotomies and  foraminotomies with decompression of bilateral L3, L4 as well as L5  nerve roots; L3-4 and L4-5 posterior lumbar  interbody fusion with local  morselized autograft bone and Actifuse/Vitoss bone graft extender;  insertion of L3-4 and L4-5 interbody prosthesis (Capstone PEEK interbody  prosthesis); L3-L5 posterior segmental instrumentation with Legacy  titanium pedicle screws and rods; L3-4 and L4-5 posterolateral  arthrodesis with local morselized autograft bone, Vitoss and Actifuse  bone graft extenders.      Cristi Loron, M.D.  Electronically Signed     Cristi Loron, M.D.  Electronically Signed   JDJ/MEDQ  D:  11/25/2008  T:  11/26/2008  Job:  [id number]

## 2011-01-26 NOTE — H&P (Signed)
[name], [name]              ACCOUNT NO.:  [id number]   MEDICAL RECORD NO.:  [id number]          PATIENT TYPE:  OUT   LOCATION:  XRAY                         FACILITY:  [facility]   PHYSICIAN:  Ines Bloomer, M.D. DATE OF BIRTH:  11/20/1940   DATE OF ADMISSION:  10/17/2005  DATE OF DISCHARGE:                                HISTORY & PHYSICAL   CHIEF COMPLAINT:  Lung mass.   HISTORY OF PRESENT ILLNESS:  This 70 year old Asian female was found to have  a left nipple discharge and had surgery scheduled for this.  This was  thought to be benign and surgery showed a left lower lobe lesion.  She had a  history of having a right upper lobectomy in 1999 for non-small cell lung  cancer.  She quit smoking six years ago and gets shortness of breath with  exertion.  Has good performance status.  She has had no hemoptysis, fever,  chills, or excessive sputum.  Pulmonary function tests showed an FVC of 1.67  with an FEV1 of 1.07.  She is scheduled for full pulmonary function tests.  A CT scan showed a 2.4 x 2 mass in the superior segment of the left lower  lobe.  PET scan was positive in this area, but in no other areas.   PAST MEDICAL HISTORY:  1.  Hypercholesterolemia.  2.  Hypertension.   She takes hydrochlorothiazide 12.5 mg a day and Lipitor 20 mg a day.   SHE IS ALLERGIC TO SULFA.   FAMILY HISTORY:  Positive for cancer and heart disease.   SOCIAL HISTORY:  She is remarried, has one child.  Quit smoking six years  ago.  Has been smoking for many years.  Drinks one to two drinks per day.   REVIEW OF SYSTEMS:  Weight has been stable.  She is 150 pounds.  She is 5  feet 2 inches.  CARDIAC:  No angina or atrial fibrillation.  PULMONARY:  An  occasional wheezing.  See history of present illness.  GI:  No dysuria.  No  peptic ulcer disease, reflux, nausea, vomiting, or constipation.  GU:  No  kidney disease, dysuria, or frequent urination.  VASCULAR:  No claudication,  DVT, or TIAs.   NEUROLOGIC:  No headaches, blackouts, or seizures.  ORTHOPEDIC:  No chronic joint pain.  PSYCHIATRIC:  No psychiatric illnesses.  HEENT:  No change in her eyesight or hearing.  HEMATOLOGIC:  No problems  with anemia.   PHYSICAL EXAMINATION:  GENERAL:  She is a well-developed Caucasian female in  no acute distress.  VITAL SIGNS:  Blood pressure 138/80, pulse 92, respirations 20, saturations  were 92%.  HEENT:  Head is atraumatic.  Pupils are equal, round, and reactive to light  and accommodation.  Extraocular movements are normal.  Ears:  Tympanic  membranes are intact.  Nose:  There is no septal deviation.  Throat without  lesion.  CHEST:  She has some mild bilateral wheezes.  Has a right thoracotomy  incision.  __________ increase in AP diameter of the chest.  NECK:  Supple.  There is no supraclavicular or  axillary adenopathy.  No  thyromegaly.  No carotid bruits.  HEART:  Regular sinus rhythm.  No murmurs.  ABDOMEN:  Soft.  There is no hepatosplenomegaly.  Bowel sounds are normal.  EXTREMITIES:  Pulses are 2+.  There is no clubbing or edema.  NEUROLOGIC:  She is oriented x3.  Cranial nerves II-XII are intact.  Sensory  and motor are intact.  SKIN:  Without lesions.   IMPRESSION:  1.  Chronic obstructive pulmonary disease.  2.  Status post right upper lobectomy for non-small cell lung cancer.  3.  New left superior segment of cancer.  4.  Left nipple discharge.  5.  Hypercholesterolemia.  6.  Hypertension.   PLAN:  Left VATS and possible left superior segmentectomy.           ______________________________  Ines Bloomer, M.D.     DPB/MEDQ  D:  10/17/2005  T:  10/17/2005  Job:  [id number]

## 2011-01-26 NOTE — Op Note (Signed)
   NAME:  [name], [name]                        ACCOUNT NO.:  [id number]   MEDICAL RECORD NO.:  [id number]                   PATIENT TYPE:  AMB   LOCATION:  ENDO                                 [facility]ACILITY[facility]  [facility]   PHYSICIAN:  Georgiana Spinner, M.D.                 DATE OF BIRTH:  04-Jul-1941   DATE OF PROCEDURE:  DATE OF DISCHARGE:                                 OPERATIVE REPORT   PROCEDURE:  Upper endoscopy with biopsy.   INDICATIONS FOR PROCEDURE:  Abdominal pain.   ANESTHESIA:  Demerol 50, Versed 6 mg.   DESCRIPTION OF PROCEDURE:  With the patient mildly sedated in the left  lateral decubitus position, the Olympus videoscopic endoscope was inserted  in the mouth and passed under direct vision through the esophagus which  appeared normal until we reached the distal esophagus and there was a  question of Barrett's photographed and biopsied. The fundus, body, antrum,  duodenal bulb and second portion of the duodenum all appeared normal. From  this point, the endoscope was slowly withdrawn taking circumferential views  of the duodenal mucosa until the endoscope was then pulled back in the  stomach, placed in retroflexion to view the stomach from below and a lax  wrap of the GE junction around the endoscope was noted and photographed.  The endoscope was then straightened and withdrawn taking circumferential  views of the remaining gastric and esophageal mucosa. The patient's vital  signs and pulse oximeter remained stable. The patient tolerated the  procedure well without apparent complications.   FINDINGS:  Unremarkable endoscopic examination other than a lax wrap of the  GE junction around the endoscope and question of a short segment Barrett's  esophagus.   PLAN:  Await biopsy report. The patient will call me for results and  followup with me as an outpatient.                                                Georgiana Spinner, M.D.    GMO/MEDQ  D:  12/04/2002  T:  12/04/2002   Job:  [id number]

## 2011-01-26 NOTE — Discharge Summary (Signed)
[name], [name]              ACCOUNT NO.:  [id number]   MEDICAL RECORD NO.:  [id number]          PATIENT TYPE:  INP   LOCATION:  3312                         FACILITY:  [facility]   PHYSICIAN:  [name], M.D. DATE OF BIRTH:  07-07-1941   DATE OF ADMISSION:  10/19/2005  DATE OF DISCHARGE:  10/23/2005                                 DISCHARGE SUMMARY   PRIMARY DIAGNOSIS:  Left lower lobe mass, non-small cell cancer left lower  lobe.   SECONDARY DIAGNOSES:  1.  Hyperlipidemia.  2.  Hypertension.   OPERATIONS/PROCEDURES:  Left video-assisted thoracoscopic surgery with left  thoracotomy, left superior segmentectomy, completion of left lower lobectomy  with node dissection.   HISTORY OF PRESENT ILLNESS AND HOSPITAL COURSE:  The patient is a 70-year-  old Asian female who has a history of right upper lobectomy for non-small  cell lung cancer in 1999 and now presents for a left lower lobe VATS.  She  had been seen for nipple discharge by Dr. Jamey Gould and on chest x-ray was  found to have a left lower lobe mass.  The patient quit smoking six years  ago.  She states she experiences shortness of breath with exertion.  PFTs  were done which showed an FVC of 1.67, with an FEV1 of 1.07.  CT scan done  showed a 2.4 x 2 mass in the superior segment of the left lower lobe.  PET  scan was positive in this area but no other areas.  The patient was seen and  evaluated by Dr. Edwyna Gould.  Dr. Edwyna Gould discussed the patient undergoing left  VATS with resection of the superior segment of the left lower lobe.  He  discussed the risks and benefits of this procedure.  The patient  acknowledged her understanding and agreed to proceed.  Surgery was scheduled  for October 19, 2005.   The patient was taken to the operating room on October 19, 2005 where she  underwent left video-assisted thoracoscopic surgery with left thoracotomy,  left superior segmentectomy, completion of left lower lobectomy with  node  dissection.  Frozen section showed this to be positive for non-small cell  cancer of the left lower lobe.  Final pathology report still pending.  The  patient tolerated this procedure well and was transferred up to the  intensive care unit in stable condition.  Her postoperative course has been  pretty much unremarkable.  Postoperative chest x-rays are seen to be stable.  No air leak was noted in the chest tube and minimal drainage noted.  The  anterior chest tube was discontinued on postoperative day #2, and the  posterior chest tube was discontinued on postoperative day #3.  Followup  chest x-rays were stable, and no pneumothorax was noted.  A followup chest x-  ray will be obtained on the morning of discharge.  This will be a PA and  lateral chest x-ray.  The patient was able to be weaned off oxygen,  saturating greater than 90% on room air.  She was out of bed and ambulating  well postoperatively.  The incisions were dry and  intact and healing well.  The patient was afebrile, and vital signs were stable postoperatively.  The  patient was tolerating her diet well, with no nausea or vomiting noted.  Oncology was consulted on postoperative day #3.  If not seen by oncology  while in house, will arrange for followup appointment as an outpatient.   The patient is tentatively ready for discharge on October 23, 2005,  postoperative day #4.  A followup appointment has been arranged with Dr.  Edwyna Gould for October 31, 2005 at 2:10 p.m.  The patient will need to obtain a  PA and lateral chest x-ray one hour prior to this appointment.  [name]  received instructions on diet, activity level, and incisional care.  She was  told no driving until released to do so, no heavy lifting over 10 pounds.  The patient was told to ambulate three to four times per day, progress as  tolerated, and to continue her breathing exercises.  She was told she is  allowed to shower, washing her incisions using  soap and water.  She is to  contact the office if she develops any drainage or opening from any of her  incision sites.  The patient was educated on diet to be low fat, low salt.   DISCHARGE MEDICATIONS:  1.  Lipitor 20 mg q.h.s.  2.  HCTZ 25 mg daily.  3.  Aspirin 81 mg daily.  4.  Fish oil daily.  5.  Calcium daily.      Peggy Belfast, PA    ______________________________  [name], M.D.    KMD/MEDQ  D:  10/22/2005  T:  10/23/2005  Job:  [id number]   cc:   [name], M.D.  [address]  [place]  [place] [address]

## 2011-01-26 NOTE — Op Note (Signed)
NAME:  [name], [name]                        ACCOUNT NO.:  [id number]   MEDICAL RECORD NO.:  [id number]                   PATIENT TYPE:  AMB   LOCATION:  ENDO                                 FACILITY:  [facility]   PHYSICIAN:  Georgiana Spinner, M.D.                 DATE OF BIRTH:  [birth year]   DATE OF PROCEDURE:  DATE OF DISCHARGE:                                 OPERATIVE REPORT   PROCEDURE:  Colonoscopy.   INDICATION:  Rectal bleeding, probably hemorrhoidal in nature.   ANESTHESIA:  Demerol 60 mg, Versed 6 mg.   DESCRIPTION OF PROCEDURE:  With the patient mildly sedated in the left  lateral decubitus position, a rectal exam was performed, an external  hemorrhoid was noted.  From this point, the colonoscope was then inserted  and passed under direct vision from the rectum to the cecum identified by  the ileocecal valve and appendiceal orifice both of which were photographed.  From this point, the colonoscope was slowly withdrawn, taking  circumferential views of the colonic mucosa, stopping only in the rectum  which appeared normal on direct and retroflex view.  Of note, I could not  get a close up view of the anal canal on retroflex view but no gross  abnormalities were noted referring to the picture taken.  The endoscope was  straightened and withdrawn.  The patient's vital signs and pulse oximetry  remained stable, the patient tolerated the procedure well without apparent  complications.   FINDINGS:  Tortuous colon but otherwise an unremarkable examination.   PLAN:  Have the patient follow up with me as needed.                                               Georgiana Spinner, M.D.    GMO/MEDQ  D:  10/11/2003  T:  10/11/2003  Job:  [id number]

## 2011-01-26 NOTE — Procedures (Signed)
[facility]  Patient:    MARICARMEN, BRAZIEL Visit Number: [id number] MRN: [id number]          Service Type: END Location: ENDO Attending Physician:  Sabino Gasser Proc. Date: 05/14/01 Admit Date:  05/14/2001                             Procedure Report  PROCEDURE:  Colonoscopy.  INDICATIONS:  Diarrhea, colon polyp.  ANESTHESIA:  Demerol 50, Versed 5 mg.  DESCRIPTION OF PROCEDURE:  With the patient mildly sedated in the left lateral decubitus position, the Olympus videoscopic colonoscope was inserted in the rectum and passed under direct vision to the cecum, identified by the ileocecal valve and appendiceal orifice, both of which were photographed. From this point, the colonoscope was slowly withdrawn, taking circumferential views of the entire colonic mucosa, stopping at 60 cm from the anal verge, at which point two polyps were seen, one smaller and one certainly larger, nearly 1 cm in size.  Both were photographed.  The was and removed using hot biopsy forceps technique, the second with snare cautery technique, both with the setting of 20-20 blended current.  A third polyp was found in the rectum which which was removed again with hot biopsy forceps technique, as we pulled all the way back to the rectum which appeared otherwise normal on direct and retroflexed view.  The endoscope was straightened and withdrawn.  The patients vital signs and pulse oximeter remained stable.  The patient tolerated the procedure well without apparent complications.  FINDINGS:  Two adjacent polyps at 60 cm from the anal verge and a third polyp in the rectum.  PLAN:  Await biopsy report.  The patient will call me for results and follow up with me as an outpatient. Attending Physician:  Sabino Gasser DD:  05/14/01 TD:  05/14/01 Job: [id number]

## 2011-02-10 ENCOUNTER — Emergency Department (HOSPITAL_COMMUNITY)
Admission: EM | Admit: 2011-02-10 | Discharge: 2011-02-11 | Disposition: A | Discharge status: Home / Self Care | Payer: Medicare Other | Attending: Emergency Medicine | Admitting: Emergency Medicine

## 2011-02-10 ENCOUNTER — Emergency Department (HOSPITAL_COMMUNITY): Payer: Medicare Other

## 2011-02-10 DIAGNOSIS — I10 Essential (primary) hypertension: Secondary | ICD-10-CM | POA: Insufficient documentation

## 2011-02-10 DIAGNOSIS — Z7982 Long term (current) use of aspirin: Secondary | ICD-10-CM | POA: Insufficient documentation

## 2011-02-10 DIAGNOSIS — M25539 Pain in unspecified wrist: Secondary | ICD-10-CM | POA: Insufficient documentation

## 2011-02-10 DIAGNOSIS — M25549 Pain in joints of unspecified hand: Secondary | ICD-10-CM | POA: Insufficient documentation

## 2011-02-10 DIAGNOSIS — Z79899 Other long term (current) drug therapy: Secondary | ICD-10-CM | POA: Insufficient documentation

## 2011-02-10 DIAGNOSIS — M7989 Other specified soft tissue disorders: Secondary | ICD-10-CM | POA: Insufficient documentation

## 2011-02-11 LAB — BASIC METABOLIC PANEL
BUN: 17 mg/dL (ref 6–23)
CO2: 26 mEq/L (ref 19–32)
Chloride: 100 mEq/L (ref 96–112)
Creatinine, Ser: 0.77 mg/dL (ref 0.4–1.2)
GFR calc Af Amer: >60 mL/min (ref >60)

## 2011-02-11 LAB — DIFFERENTIAL
Lymphocytes Relative: 21 % (ref 12–46)
Lymphs Abs: 1 10*3/uL (ref 0.7–4.0)
Monocytes Relative: 10 % (ref 3–12)
Neutro Abs: 3.3 10*3/uL (ref 1.7–7.7)
Neutrophils Relative %: 67 % (ref 43–77)

## 2011-02-11 LAB — SEDIMENTATION RATE: Sed Rate: 40 mm/hr — ABNORMAL HIGH (ref 0–22)

## 2011-02-11 LAB — CBC
Hemoglobin: 13.6 g/dL (ref 12.0–15.0)
MCH: 34.9 pg — ABNORMAL HIGH (ref 26.0–34.0)
MCV: 102.3 fL — ABNORMAL HIGH (ref 78.0–100.0)
RBC: 3.9 MIL/uL (ref 3.87–5.11)

## 2011-05-07 ENCOUNTER — Other Ambulatory Visit: Payer: Self-pay | Admitting: Internal Medicine

## 2011-05-07 DIAGNOSIS — Z1231 Encounter for screening mammogram for malignant neoplasm of breast: Secondary | ICD-10-CM

## 2011-05-23 ENCOUNTER — Ambulatory Visit
Admission: RE | Admit: 2011-05-23 | Discharge: 2011-05-23 | Disposition: A | Discharge status: Home / Self Care | Payer: Medicare Other | Source: Ambulatory Visit | Attending: Internal Medicine | Admitting: Internal Medicine

## 2011-05-23 DIAGNOSIS — Z1231 Encounter for screening mammogram for malignant neoplasm of breast: Secondary | ICD-10-CM

## 2011-10-27 ENCOUNTER — Encounter (HOSPITAL_COMMUNITY): Payer: Self-pay | Admitting: Emergency Medicine

## 2011-10-27 ENCOUNTER — Emergency Department (HOSPITAL_COMMUNITY): Payer: Medicare Other

## 2011-10-27 ENCOUNTER — Emergency Department (HOSPITAL_COMMUNITY)
Admission: EM | Admit: 2011-10-27 | Discharge: 2011-10-27 | Disposition: A | Discharge status: Home / Self Care | Payer: Medicare Other | Attending: Emergency Medicine | Admitting: Emergency Medicine

## 2011-10-27 DIAGNOSIS — F10929 Alcohol use, unspecified with intoxication, unspecified: Secondary | ICD-10-CM

## 2011-10-27 DIAGNOSIS — W1809XA Striking against other object with subsequent fall, initial encounter: Secondary | ICD-10-CM | POA: Insufficient documentation

## 2011-10-27 DIAGNOSIS — IMO0002 Reserved for concepts with insufficient information to code with codable children: Secondary | ICD-10-CM

## 2011-10-27 DIAGNOSIS — S0100XA Unspecified open wound of scalp, initial encounter: Secondary | ICD-10-CM | POA: Insufficient documentation

## 2011-10-27 MED ORDER — TETANUS-DIPHTH-ACELL PERTUSSIS 5-2.5-18.5 LF-MCG/0.5 IM SUSP
0.5000 mL | Freq: Once | INTRAMUSCULAR | Status: AC
  Administered 2011-10-27: 0.5 mL via INTRAMUSCULAR
  Filled 2011-10-27: qty 0.5

## 2011-10-27 MED ORDER — SODIUM CHLORIDE 0.9 % IV BOLUS (SEPSIS)
1000.0000 mL | Freq: Once | INTRAVENOUS | Status: AC
  Administered 2011-10-27: 1000 mL via INTRAVENOUS

## 2011-10-27 NOTE — Discharge Instructions (Signed)
Laceration Care, Adult       A laceration is a cut or lesion that goes through all layers of the skin and into the tissue just beneath the skin.   TREATMENT   Some lacerations may not require closure. Some lacerations may not be able to be closed due to an increased risk of infection. It is important to see your caregiver as soon as possible after an injury to minimize the risk of infection and maximize the opportunity for successful closure.   If closure is appropriate, pain medicines may be given, if needed. The wound will be cleaned to help prevent infection. Your caregiver will use stitches (sutures), staples, wound glue (adhesive), or skin adhesive strips to repair the laceration. These tools bring the skin edges together to allow for faster healing and a better cosmetic outcome. However, all wounds will heal with a scar. Once the wound has healed, scarring can be minimized by covering the wound with sunscreen during the day for 1 full year.   HOME CARE INSTRUCTIONS   For sutures or staples:   Keep the wound clean and dry.   If you were given a bandage (dressing), you should change it at least once a day. Also, change the dressing if it becomes wet or dirty, or as directed by your caregiver.   Wash the wound with soap and water 2 times a day. Rinse the wound off with water to remove all soap. Pat the wound dry with a clean towel.   After cleaning, apply a thin layer of the antibiotic ointment as recommended by your caregiver. This will help prevent infection and keep the dressing from sticking.   You may shower as usual after the first 24 hours. Do not soak the wound in water until the sutures are removed.   Only take over-the-counter or prescription medicines for pain, discomfort, or fever as directed by your caregiver.   Get your sutures or staples removed as directed by your caregiver.  For skin adhesive strips:   Keep the wound clean and dry.   Do not get the skin adhesive strips wet. You may bathe  carefully, using caution to keep the wound dry.   If the wound gets wet, pat it dry with a clean towel.   Skin adhesive strips will fall off on their own. You may trim the strips as the wound heals. Do not remove skin adhesive strips that are still stuck to the wound. They will fall off in time.  For wound adhesive:   You may briefly wet your wound in the shower or bath. Do not soak or scrub the wound. Do not swim. Avoid periods of heavy perspiration until the skin adhesive has fallen off on its own. After showering or bathing, gently pat the wound dry with a clean towel.   Do not apply liquid medicine, cream medicine, or ointment medicine to your wound while the skin adhesive is in place. This may loosen the film before your wound is healed.   If a dressing is placed over the wound, be careful not to apply tape directly over the skin adhesive. This may cause the adhesive to be pulled off before the wound is healed.   Avoid prolonged exposure to sunlight or tanning lamps while the skin adhesive is in place. Exposure to ultraviolet light in the first year will darken the scar.   The skin adhesive will usually remain in place for 5 to 10 days, then naturally fall off the skin.   You have never had a tetanus shot.  If you get a tetanus shot, your arm may swell, get red, and feel warm to the touch. This is common and not a problem. If you need a tetanus shot and you choose not to have one, there is a rare chance of getting tetanus. Sickness from tetanus can be serious.  SEEK MEDICAL CARE IF:  You have redness, swelling, or increasing pain in the wound.  You see a red line that goes away from the wound.  You have yellowish-white fluid (pus) coming from the wound.  You have a fever.  You notice a bad smell coming from the wound or dressing.  Your wound breaks open before or after sutures have been  removed.  You notice something coming out of the wound such as wood or glass.  Your wound is on your hand or foot and you cannot move a finger or toe.  SEEK IMMEDIATE MEDICAL CARE IF:  Your pain is not controlled with prescribed medicine.  You have severe swelling around the wound causing pain and numbness or a change in color in your arm, hand, leg, or foot.  Your wound splits open and starts bleeding.  You have worsening numbness, weakness, or loss of function of any joint around or beyond the wound.  You develop painful lumps near the wound or on the skin anywhere on your body.  MAKE SURE YOU:  Understand these instructions.  Will watch your condition.  Will get help right away if you are not doing well or get worse.     Alcohol Intoxication Alcohol intoxication means your blood alcohol level is above legal limits. Alcohol is a drug. It has serious side effects. These side effects can include:  Damage to your organs (liver, nervous system, and blood system).   Unclear thinking.   Slowed reflexes.   Decreased muscle coordination.  HOME CARE  Do not drink and drive.   Do not drink alcohol if you are taking medicine or using other drugs. Doing so can cause serious medical problems or even death.   Drink enough water and fluids to keep your pee (urine) clear or pale yellow.   Eat healthy foods.   Only take medicine as told by your doctor.   Join an alcohol support group.  GET HELP RIGHT AWAY IF:  You become shaky when you stop drinking.   Your thinking is unclear or you become confused.   You throw up (vomit) blood. It may look bright red or like coffee grounds.   You notice blood in your poop (bowel movements).   You become lightheaded or pass out (faint).  MAKE SURE YOU:   Understand these instructions.   Will watch your condition.   Will get help right away if you are not doing well or get worse.  Document Released: 02/13/2008 Document Revised: 05/09/2011  Document Reviewed: 02/13/2010 [facility] Patient Information 2012 [place], [place].Alcohol Intoxication Alcohol intoxication means your blood alcohol level is above legal limits. Alcohol is a drug. It has serious side effects. These side effects can include:  Damage to your organs (liver, nervous system, and blood system).   Unclear thinking.   Slowed reflexes.   Decreased muscle coordination.  HOME CARE  Do not drink and drive.   Do not drink alcohol if you are taking medicine or using other drugs. Doing so can cause serious medical problems or even death.   Drink enough water and fluids to keep your pee (urine) clear or  pale yellow.   Eat healthy foods.   Only take medicine as told by your doctor.   Join an alcohol support group.  GET HELP RIGHT AWAY IF:  You become shaky when you stop drinking.   Your thinking is unclear or you become confused.   You throw up (vomit) blood. It may look bright red or like coffee grounds.   You notice blood in your poop (bowel movements).   You become lightheaded or pass out (faint).  MAKE SURE YOU:   Understand these instructions.   Will watch your condition.   Will get help right away if you are not doing well or get worse.  Document Released: 02/13/2008 Document Revised: 05/09/2011 Document Reviewed: 02/13/2010 [facility] Patient Information 2012 [place], [place].

## 2011-10-27 NOTE — ED Provider Notes (Signed)
History     CSN: [id number]  Arrival date & time 10/27/11  0024   First MD Initiated Contact with Patient 10/27/11 0030      Chief Complaint  Patient presents with  . Fall    fell in the bathroom and hit her head. patient admit having 2 drinks of vodka before fall.    (Consider location/radiation/quality/duration/timing/severity/associated sxs/prior treatment) Patient is a 71 y.o. female presenting with fall. The history is provided by the patient.  Fall The accident occurred less than 1 hour ago. The fall occurred while walking. She fell from a height of 1 to 2 ft. She landed on a hard floor. The volume of blood lost was minimal. The point of impact was the head. The pain is present in the head. The pain is mild. She was ambulatory at the scene. There was no entrapment after the fall. There was alcohol use involved in the accident. Pertinent negatives include no visual change, no fever, no numbness, no abdominal pain, no nausea, no vomiting, no headaches, no hearing loss, no loss of consciousness and no tingling. The symptoms are aggravated by pressure on the injury. Prehospitalization: Bandages to head wound. She has tried nothing for the symptoms.   at home.use the bathroom, and slipped striking her for head on the bathtub. She sustained a laceration with bleeding controlled prior to arrival. She denies LOC. She denies neck pain. She admits to alcohol tonight. No other injury. She was able to ambulate after falling. Last tetanus shot unknown. Pain is sharp in quality and not radiating from scalp.  Past Medical History  Diagnosis Date  . rt lung ca dx'd 2001    ru-lobectomy  . lt lung ca dx'd 2007    lu-lobectomy    History reviewed. No pertinent past surgical history.  History reviewed. No pertinent family history.  History  Substance Use Topics  . Smoking status: Former Games developer  . Smokeless tobacco: Not on file  . Alcohol Use: Yes     2 drinks a day    OB History    Grav  Para Term Preterm Abortions TAB SAB Ect Mult Living                  Review of Systems  Constitutional: Negative for fever and chills.  HENT: Negative for neck pain and neck stiffness.   Eyes: Negative for pain.  Respiratory: Negative for shortness of breath.   Cardiovascular: Negative for chest pain.  Gastrointestinal: Negative for nausea, vomiting and abdominal pain.  Genitourinary: Negative for dysuria.  Musculoskeletal: Negative for back pain.  Skin: Positive for wound. Negative for rash.  Neurological: Negative for tingling, loss of consciousness, numbness and headaches.  All other systems reviewed and are negative.    Allergies  Sulfa antibiotics  Home Medications   Current Outpatient Rx  Name Route Sig Dispense Refill  . ASPIRIN 81 MG PO TABS Oral Take 81 mg by mouth daily.    . ATORVASTATIN CALCIUM 20 MG PO TABS Oral Take 20 mg by mouth daily.    Marland Kitchen VITAMIN D PO Oral Take 2 tablets by mouth daily.    Marland Kitchen HYDROCHLOROTHIAZIDE 25 MG PO TABS Oral Take 25 mg by mouth daily.    Marland Kitchen POTASSIUM CHLORIDE CRYS ER 20 MEQ PO TBCR Oral Take 20 mEq by mouth daily.      BP 97/57  Pulse 85  Temp(Src) 97.8 F (36.6 C) (Oral)  Resp 15  Ht 5\' 2"  (1.575 m)  Wt 122  lb (55.339 kg)  BMI 22.31 kg/m2  SpO2 96%  Physical Exam  Constitutional: She is oriented to person, place, and time. She appears well-developed and well-nourished.  HENT:  Head: Normocephalic.       3 cm full-thickness laceration to scalp midline just above anterior hairline. No active bleeding. No underlying bony deformity. No epistaxis. No entrapment with extraocular movements intact. No trismus and dentition intact.  Eyes: Conjunctivae and EOM are normal. Pupils are equal, round, and reactive to light.  Neck: Trachea normal. Neck supple. No thyromegaly present.  Cardiovascular: Normal rate, regular rhythm, S1 normal, S2 normal and normal pulses.     No systolic murmur is present   No diastolic murmur is present    Pulses:      Radial pulses are 2+ on the right side, and 2+ on the left side.  Pulmonary/Chest: Effort normal and breath sounds normal. She has no wheezes. She has no rhonchi. She has no rales. She exhibits no tenderness.  Abdominal: Soft. Normal appearance and bowel sounds are normal. There is no tenderness. There is no CVA tenderness and negative Murphy's sign.  Musculoskeletal:       BLE:s Calves nontender, no cords or erythema, negative Homans sign  Neurological: She is alert and oriented to person, place, and time. She has normal strength. No cranial nerve deficit or sensory deficit. GCS eye subscore is 4. GCS verbal subscore is 5. GCS motor subscore is 6.  Skin: Skin is warm and dry. No rash noted. She is not diaphoretic.  Psychiatric: Her speech is normal.       Cooperative and appropriate    ED Course  LACERATION REPAIR Date/Time: 10/27/2011 2:53 AM Performed by: Sunnie Nielsen Authorized by: Sunnie Nielsen Consent: Verbal consent obtained. Risks and benefits: risks, benefits and alternatives were discussed Consent given by: patient Patient understanding: patient states understanding of the procedure being performed Patient consent: the patient's understanding of the procedure matches consent given Procedure consent: procedure consent matches procedure scheduled Patient identity confirmed: verbally with patient Time out: Immediately prior to procedure a "time out" was called to verify the correct patient, procedure, equipment, support staff and site/side marked as required. Body area: head/neck Location details: scalp Laceration length: 3 cm Tendon involvement: none Nerve involvement: none Vascular damage: no Anesthesia: local infiltration Local anesthetic: lidocaine 1% with epinephrine Anesthetic total: 2 ml Preparation: Patient was prepped and draped in the usual sterile fashion. Irrigation solution: saline Irrigation method: syringe Debridement: none Degree of  undermining: none Skin closure: staples Number of sutures: 3 Technique: simple Approximation: close Dressing: antibiotic ointment Patient tolerance: Patient tolerated the procedure well with no immediate complications.   (including critical care time)  Labs Reviewed - No data to display Ct Head Wo Contrast  10/27/2011  *RADIOLOGY REPORT*  Clinical Data:  Status post fall in bathroom; large laceration to the right side of the head.  Concern for cervical spine injury.  CT HEAD WITHOUT CONTRAST AND CT CERVICAL SPINE WITHOUT CONTRAST  Technique:  Multidetector CT imaging of the head and cervical spine was performed following the standard protocol without intravenous contrast.  Multiplanar CT image reconstructions of the cervical spine were also generated.  Comparison: MRI of the brain, and PET / CT, performed 10/17/2005  CT HEAD  Findings: There is no evidence of acute infarction, mass lesion, or intra- or extra-axial hemorrhage on CT.  Scattered periventricular and subcortical white matter change likely reflects small vessel ischemic microangiopathy.  The posterior fossa, including the cerebellum,  brainstem and fourth ventricle, is within normal limits.  The third and lateral ventricles, and basal ganglia are unremarkable in appearance.  The cerebral hemispheres demonstrate grossly normal gray-white differentiation.  No mass effect or midline shift is seen.  There is no evidence of fracture; visualized osseous structures are unremarkable in appearance.  The visualized portions of the orbits are within normal limits.  The paranasal sinuses and right mastoid air cells are well-aerated.  The patient is status post left-sided mastoidectomy.  A soft tissue laceration is noted overlying the high right frontal calvarium.  IMPRESSION:  1.  No evidence of traumatic intracranial injury or fracture. 2.  Soft tissue laceration overlying the high right frontal calvarium. 3.  Scattered small vessel ischemic  microangiopathy. 4.  Postoperative changes, status post left-sided mastoidectomy.  CT CERVICAL SPINE  Findings: There is no evidence of fracture or subluxation. Vertebral bodies demonstrate normal height and alignment.  There is multilevel disc space narrowing, with associated anterior and posterior disc osteophyte complexes along the cervical spine. Prevertebral soft tissues are within normal limits.  Facet disease is noted along the cervical spine.  The thyroid gland is unremarkable in appearance.  Minimal emphysematous change is noted at the lung apices.  No significant soft tissue abnormalities are seen.  IMPRESSION:  1.  No evidence of fracture or subluxation along the cervical spine. 2.  Mild degenerative change noted along the cervical spine. 3.  Minimal emphysema noted at the lung apices.  Original Report Authenticated By: Tonia Ghent, M.D.   Ct Cervical Spine Wo Contrast  10/27/2011  *RADIOLOGY REPORT*  Clinical Data:  Status post fall in bathroom; large laceration to the right side of the head.  Concern for cervical spine injury.  CT HEAD WITHOUT CONTRAST AND CT CERVICAL SPINE WITHOUT CONTRAST  Technique:  Multidetector CT imaging of the head and cervical spine was performed following the standard protocol without intravenous contrast.  Multiplanar CT image reconstructions of the cervical spine were also generated.  Comparison: MRI of the brain, and PET / CT, performed 10/17/2005  CT HEAD  Findings: There is no evidence of acute infarction, mass lesion, or intra- or extra-axial hemorrhage on CT.  Scattered periventricular and subcortical white matter change likely reflects small vessel ischemic microangiopathy.  The posterior fossa, including the cerebellum, brainstem and fourth ventricle, is within normal limits.  The third and lateral ventricles, and basal ganglia are unremarkable in appearance.  The cerebral hemispheres demonstrate grossly normal gray-white differentiation.  No mass effect or  midline shift is seen.  There is no evidence of fracture; visualized osseous structures are unremarkable in appearance.  The visualized portions of the orbits are within normal limits.  The paranasal sinuses and right mastoid air cells are well-aerated.  The patient is status post left-sided mastoidectomy.  A soft tissue laceration is noted overlying the high right frontal calvarium.  IMPRESSION:  1.  No evidence of traumatic intracranial injury or fracture. 2.  Soft tissue laceration overlying the high right frontal calvarium. 3.  Scattered small vessel ischemic microangiopathy. 4.  Postoperative changes, status post left-sided mastoidectomy.  CT CERVICAL SPINE  Findings: There is no evidence of fracture or subluxation. Vertebral bodies demonstrate normal height and alignment.  There is multilevel disc space narrowing, with associated anterior and posterior disc osteophyte complexes along the cervical spine. Prevertebral soft tissues are within normal limits.  Facet disease is noted along the cervical spine.  The thyroid gland is unremarkable in appearance.  Minimal emphysematous change is noted  at the lung apices.  No significant soft tissue abnormalities are seen.  IMPRESSION:  1.  No evidence of fracture or subluxation along the cervical spine. 2.  Mild degenerative change noted along the cervical spine. 3.  Minimal emphysema noted at the lung apices.  Original Report Authenticated By: Tonia Ghent, M.D.       MDM   Fall with head laceration. Husband bedside. Evaluated with CT scans as above. Wound repaired. Tetanus updated. Patient verbalizes understanding wound infection precautions. Plan staple removal 10 days. Ambulates no acute distress. Stable for discharge home.        Sunnie Nielsen, MD 10/27/11 [phone]

## 2012-01-04 ENCOUNTER — Ambulatory Visit (HOSPITAL_COMMUNITY)
Admission: RE | Admit: 2012-01-04 | Discharge: 2012-01-04 | Disposition: A | Discharge status: Home / Self Care | Payer: Medicare Other | Source: Ambulatory Visit | Attending: Internal Medicine | Admitting: Internal Medicine

## 2012-01-04 ENCOUNTER — Other Ambulatory Visit (HOSPITAL_BASED_OUTPATIENT_CLINIC_OR_DEPARTMENT_OTHER): Payer: Medicare Other | Admitting: Lab

## 2012-01-04 DIAGNOSIS — J438 Other emphysema: Secondary | ICD-10-CM | POA: Insufficient documentation

## 2012-01-04 DIAGNOSIS — C3430 Malignant neoplasm of lower lobe, unspecified bronchus or lung: Secondary | ICD-10-CM

## 2012-01-04 DIAGNOSIS — Z902 Acquired absence of lung [part of]: Secondary | ICD-10-CM | POA: Insufficient documentation

## 2012-01-04 DIAGNOSIS — C3490 Malignant neoplasm of unspecified part of unspecified bronchus or lung: Secondary | ICD-10-CM | POA: Insufficient documentation

## 2012-01-04 LAB — CMP (CANCER CENTER ONLY)
Albumin: 3.2 g/dL — ABNORMAL LOW (ref 3.3–5.5)
CO2: 31 mEq/L (ref 18–33)
Calcium: 9.1 mg/dL (ref 8.0–10.3)
Chloride: 97 mEq/L — ABNORMAL LOW (ref 98–108)
Glucose, Bld: 89 mg/dL (ref 73–118)
Sodium: 142 mEq/L (ref 128–145)
Total Bilirubin: 1 mg/dl (ref 0.20–1.60)
Total Protein: 6.9 g/dL (ref 6.4–8.1)

## 2012-01-04 LAB — CBC WITH DIFFERENTIAL/PLATELET
Basophils Absolute: 0 10*3/uL (ref 0.0–0.1)
Eosinophils Absolute: 0.2 10*3/uL (ref 0.0–0.5)
HCT: 38.9 % (ref 34.8–46.6)
HGB: 12.9 g/dL (ref 11.6–15.9)
LYMPH%: 22.3 % (ref 14.0–49.7)
MONO#: 0.5 10*3/uL (ref 0.1–0.9)
NEUT#: 2.2 10*3/uL (ref 1.5–6.5)
Platelets: 183 10*3/uL (ref 145–400)
RBC: 3.85 10*6/uL (ref 3.70–5.45)
WBC: 3.7 10*3/uL — ABNORMAL LOW (ref 3.9–10.3)

## 2012-01-04 MED ORDER — IOHEXOL 300 MG/ML  SOLN
100.0000 mL | Freq: Once | INTRAMUSCULAR | Status: AC | PRN
  Administered 2012-01-04: 100 mL via INTRAVENOUS

## 2012-01-09 ENCOUNTER — Telehealth: Payer: Self-pay | Admitting: Internal Medicine

## 2012-01-09 NOTE — Telephone Encounter (Signed)
pt called to r/s 5/6 to 5/7   aom

## 2012-01-14 ENCOUNTER — Ambulatory Visit: Payer: Medicare Other | Admitting: Internal Medicine

## 2012-01-15 ENCOUNTER — Telehealth: Payer: Self-pay | Admitting: Internal Medicine

## 2012-01-15 ENCOUNTER — Ambulatory Visit (HOSPITAL_BASED_OUTPATIENT_CLINIC_OR_DEPARTMENT_OTHER): Payer: Medicare Other | Admitting: Internal Medicine

## 2012-01-15 VITALS — BP 118/71 | HR 92 | Temp 97.4°F | Ht 62.0 in | Wt 124.4 lb

## 2012-01-15 DIAGNOSIS — C3430 Malignant neoplasm of lower lobe, unspecified bronchus or lung: Secondary | ICD-10-CM

## 2012-01-15 DIAGNOSIS — Z85118 Personal history of other malignant neoplasm of bronchus and lung: Secondary | ICD-10-CM

## 2012-01-15 DIAGNOSIS — C3490 Malignant neoplasm of unspecified part of unspecified bronchus or lung: Secondary | ICD-10-CM

## 2012-01-15 NOTE — Telephone Encounter (Signed)
Gave pt appt for 2014 May lab Ct and MD

## 2012-01-15 NOTE — Progress Notes (Signed)
[facility] [phone]   Fax:([phone]) [phone]  OFFICE PROGRESS NOTE  Georgianne Fick, MD, MD [address], [address] [facility] [place] [address]  PRINCIPAL DIAGNOSIS:   1. Stage IB (T2 N0 M0) non-small cell lung cancer diagnosed in February of 2007. 2. History of stage IA non-small cell lung cancer diagnosed in 1999.  PRIOR THERAPY:   1. Status post right upper lobectomy in 1999. 2. Status post left lower lobectomy in February 2007 under the care of Dr. Edwyna Shell.  CURRENT THERAPY:  Observation.  INTERVAL HISTORY: [name] 71 y.o. female returns to the clinic today for annual followup visit. The patient is doing fine with no specific complaints. She wants the first place in [facility]. The patient denied having any significant weight loss or night sweats. She has no chest pain or shortness of breath, no cough or hemoptysis. She has repeat CT scan of the chest performed recently and she is here today for evaluation and discussion of her scan results.  MEDICAL HISTORY: Past Medical History  Diagnosis Date  . rt lung ca dx'd 2001    ru-lobectomy  . lt lung ca dx'd 2007    lu-lobectomy    ALLERGIES:  is allergic to sulfa antibiotics.  MEDICATIONS:  Current Outpatient Prescriptions  Medication Sig Dispense Refill  . aspirin 81 MG tablet Take 81 mg by mouth daily.      Marland Kitchen atorvastatin (LIPITOR) 20 MG tablet Take 20 mg by mouth daily.      . Cholecalciferol (VITAMIN D PO) Take 2 tablets by mouth daily.      . hydrochlorothiazide (HYDRODIURIL) 25 MG tablet Take 25 mg by mouth daily.      . potassium chloride SA (K-DUR,KLOR-CON) 20 MEQ tablet Take 20 mEq by mouth daily.        REVIEW OF SYSTEMS:  A comprehensive review of systems was negative.   PHYSICAL EXAMINATION: General appearance: alert, cooperative and no distress Head: Normocephalic, without obvious abnormality, atraumatic Neck: no  adenopathy Lymph nodes: Cervical, supraclavicular, and axillary nodes normal. Resp: clear to auscultation bilaterally Cardio: regular rate and rhythm, S1, S2 normal, no murmur, click, rub or gallop GI: soft, non-tender; bowel sounds normal; no masses,  no organomegaly Extremities: extremities normal, atraumatic, no cyanosis or edema Neurologic: Alert and oriented X 3, normal strength and tone. Normal symmetric reflexes. Normal coordination and gait  ECOG PERFORMANCE STATUS: 0 - Asymptomatic  Blood pressure 118/71, pulse 92, temperature 97.4 F (36.3 C), temperature source Oral, height 5\' 2"  (1.575 m), weight 124 lb 6.4 oz (56.427 kg).  LABORATORY DATA: Lab Results  Component Value Date   WBC 3.7* 01/04/2012   HGB 12.9 01/04/2012   HCT 38.9 01/04/2012   MCV 101.0 01/04/2012   PLT 183 01/04/2012      Chemistry      Component Value Date/Time   NA 142 01/04/2012 1029   NA 139 02/10/2011 2350   K 4.0 01/04/2012 1029   K 3.7 02/10/2011 2350   CL 97* 01/04/2012 1029   CL 100 02/10/2011 2350   CO2 31 01/04/2012 1029   CO2 26 02/10/2011 2350   BUN 11 01/04/2012 1029   BUN 17 02/10/2011 2350   CREATININE 1.0 01/04/2012 1029   CREATININE 0.77 02/10/2011 2350      Component Value Date/Time   CALCIUM 9.1 01/04/2012 1029   CALCIUM 9.5 02/10/2011 2350   ALKPHOS 33 01/04/2012 1029   ALKPHOS 43 06/28/2010  0907   AST 33 01/04/2012 1029   AST 60* 06/28/2010 0907   ALT 34 06/28/2010 0907   BILITOT 1.00 01/04/2012 1029   BILITOT 1.3* 06/28/2010 0907       RADIOGRAPHIC STUDIES: Ct Chest W Contrast  01/04/2012  *RADIOLOGY REPORT*  Clinical Data: Right-sided lung cancer diagnosed in 2001.  Left- sided lung cancer in 2007.  Bilateral upper lobectomy.  Surgery only.  No chest complaints.  CT CHEST WITH CONTRAST  Technique:  Multidetector CT imaging of the chest was performed following the standard protocol during bolus administration of intravenous contrast.  Contrast: OMNIPAQUE IOHEXOL 300 MG/ML  SOLN   Comparison: 12/29/2010  Findings: Lung windows demonstrate surgical changes bilaterally. Moderate centrilobular emphysema.  Probable subpleural lymph node 1 mm within the right anterior lung on image 22.  Not definitely present on the prior. Probable scarring more inferiorly in the anterior right lung on image 34, unchanged.  Mild volume loss at the left lung base on image 45 is new.  Soft tissue windows demonstrate no supraclavicular adenopathy.  6 mm right thyroid nodule is nonspecific.  Heart size accentuated by mild pectus deformity. No pericardial or pleural effusion.  No central pulmonary embolism, on this non-dedicated study.  Small middle mediastinal nodes are unchanged No mediastinal or hilar adenopathy.  Limited abdominal imaging demonstrates normal adrenal glands. Suspect upper pole punctate left renal collecting system calculus. Surgical defects of left sided ribs.  IMPRESSION:  1.  Stable surgical changes bilaterally. 2. No acute process or evidence of metastatic disease in the chest.  Original Report Authenticated By: Consuello Bossier, M.D.    ASSESSMENT: This is a very pleasant 71 years old white female with history of stage IB non-small cell lung cancer diagnosed in February of 2007 in addition to a history of stage IA non-small cell lung cancer diagnosed in 1999. She status post right upper lobectomy as well as left lower lobectomy. The patient is doing fine and she has no evidence for disease recurrence.  PLAN: I discussed the scan results with the patient and recommended for her continuous observation for now. I would see her back for followup visit in one year with repeat CT scan of the chest without contrast. She was advised to call me immediately if she has any concerning symptoms in the interval.  All questions were answered. The patient knows to call the clinic with any problems, questions or concerns. We can certainly see the patient much sooner if necessary.

## 2012-05-01 ENCOUNTER — Other Ambulatory Visit: Payer: Self-pay | Admitting: Internal Medicine

## 2012-05-01 DIAGNOSIS — Z1231 Encounter for screening mammogram for malignant neoplasm of breast: Secondary | ICD-10-CM

## 2012-05-28 ENCOUNTER — Ambulatory Visit: Payer: Medicare Other

## 2012-07-04 ENCOUNTER — Ambulatory Visit
Admission: RE | Admit: 2012-07-04 | Discharge: 2012-07-04 | Disposition: A | Discharge status: Home / Self Care | Payer: Medicare Other | Source: Ambulatory Visit | Attending: Internal Medicine | Admitting: Internal Medicine

## 2012-07-04 DIAGNOSIS — Z1231 Encounter for screening mammogram for malignant neoplasm of breast: Secondary | ICD-10-CM

## 2013-01-12 ENCOUNTER — Other Ambulatory Visit (HOSPITAL_BASED_OUTPATIENT_CLINIC_OR_DEPARTMENT_OTHER): Payer: Medicare Other | Admitting: Lab

## 2013-01-12 ENCOUNTER — Ambulatory Visit (HOSPITAL_COMMUNITY)
Admission: RE | Admit: 2013-01-12 | Discharge: 2013-01-12 | Disposition: A | Discharge status: Home / Self Care | Payer: Medicare Other | Source: Ambulatory Visit | Attending: Internal Medicine | Admitting: Internal Medicine

## 2013-01-12 DIAGNOSIS — Z902 Acquired absence of lung [part of]: Secondary | ICD-10-CM | POA: Insufficient documentation

## 2013-01-12 DIAGNOSIS — C3430 Malignant neoplasm of lower lobe, unspecified bronchus or lung: Secondary | ICD-10-CM

## 2013-01-12 DIAGNOSIS — C3490 Malignant neoplasm of unspecified part of unspecified bronchus or lung: Secondary | ICD-10-CM

## 2013-01-12 DIAGNOSIS — E041 Nontoxic single thyroid nodule: Secondary | ICD-10-CM | POA: Insufficient documentation

## 2013-01-12 LAB — COMPREHENSIVE METABOLIC PANEL (CC13)
Albumin: 3.4 g/dL — ABNORMAL LOW (ref 3.5–5.0)
CO2: 31 mEq/L — ABNORMAL HIGH (ref 22–29)
Chloride: 101 mEq/L (ref 98–107)
Glucose: 88 mg/dl (ref 70–99)
Potassium: 4.6 mEq/L (ref 3.5–5.1)
Sodium: 141 mEq/L (ref 136–145)
Total Protein: 7.1 g/dL (ref 6.4–8.3)

## 2013-01-12 LAB — CBC WITH DIFFERENTIAL/PLATELET
Eosinophils Absolute: 0.2 10*3/uL (ref 0.0–0.5)
MONO#: 0.6 10*3/uL (ref 0.1–0.9)
NEUT#: 3.6 10*3/uL (ref 1.5–6.5)
Platelets: 303 10*3/uL (ref 145–400)
RBC: 4.02 10*6/uL (ref 3.70–5.45)
RDW: 14 % (ref 11.2–14.5)
WBC: 5.8 10*3/uL (ref 3.9–10.3)

## 2013-01-13 ENCOUNTER — Telehealth: Payer: Self-pay | Admitting: Internal Medicine

## 2013-01-13 ENCOUNTER — Ambulatory Visit: Payer: Medicare Other | Admitting: Internal Medicine

## 2013-01-13 NOTE — Telephone Encounter (Signed)
S/w pt in re to appt d/t change to 05/12 @ 1:45 w/Dr. MM.  New Calendar mailed.

## 2013-01-14 ENCOUNTER — Ambulatory Visit: Payer: Medicare Other | Admitting: Internal Medicine

## 2013-01-19 ENCOUNTER — Telehealth: Payer: Self-pay | Admitting: Internal Medicine

## 2013-01-19 ENCOUNTER — Encounter: Payer: Self-pay | Admitting: Internal Medicine

## 2013-01-19 ENCOUNTER — Ambulatory Visit (HOSPITAL_BASED_OUTPATIENT_CLINIC_OR_DEPARTMENT_OTHER): Payer: Medicare Other | Admitting: Internal Medicine

## 2013-01-19 VITALS — BP 119/70 | HR 86 | Temp 97.2°F | Resp 20 | Ht 62.0 in | Wt 120.0 lb

## 2013-01-19 DIAGNOSIS — C3490 Malignant neoplasm of unspecified part of unspecified bronchus or lung: Secondary | ICD-10-CM | POA: Insufficient documentation

## 2013-01-19 DIAGNOSIS — Z85118 Personal history of other malignant neoplasm of bronchus and lung: Secondary | ICD-10-CM

## 2013-01-19 NOTE — Telephone Encounter (Signed)
, °

## 2013-01-19 NOTE — Progress Notes (Signed)
[facility] Telephone:([phone]) [phone]   Fax:([phone]) [phone]  OFFICE PROGRESS NOTE  Georgianne Fick, MD [address] [place] [address]  PRINCIPAL DIAGNOSIS:  1. Stage IB (T2 N0 M0) non-small cell lung cancer diagnosed in February of 2007. 2. History of stage IA non-small cell lung cancer diagnosed in 1999.  PRIOR THERAPY:  1. Status post right upper lobectomy in 1999. 2. Status post left lower lobectomy in February 2007 under the care of Dr. Edwyna Shell.  CURRENT THERAPY: Observation.  INTERVAL HISTORY: [name] 72 y.o. female returns to the clinic today for annual followup visit. The patient is feeling fine today with no specific complaints. Unfortunately she lost her husband recently. She denied having any significant chest pain, shortness breath, cough or hemoptysis. She denied having any significant weight loss or night sweats. The patient had repeat CT scan of the chest performed recently and she is here for evaluation and discussion of her scan results.  MEDICAL HISTORY: Past Medical History  Diagnosis Date  . rt lung ca dx'd 2001    ru-lobectomy  . lt lung ca dx'd 2007    lu-lobectomy    ALLERGIES:  is allergic to sulfa antibiotics.  MEDICATIONS:  Current Outpatient Prescriptions  Medication Sig Dispense Refill  . acetaminophen (TYLENOL) 500 MG tablet Take 500 mg by mouth every 6 (six) hours as needed for pain.      Marland Kitchen aspirin 81 MG tablet Take 81 mg by mouth daily.      Marland Kitchen atorvastatin (LIPITOR) 20 MG tablet Take 20 mg by mouth daily.      . Cholecalciferol (VITAMIN D PO) Take 2 tablets by mouth daily.      . hydrochlorothiazide (HYDRODIURIL) 25 MG tablet Take 25 mg by mouth daily.      . potassium chloride SA (K-DUR,KLOR-CON) 20 MEQ tablet Take 20 mEq by mouth daily.       No current facility-administered medications for this visit.    REVIEW OF SYSTEMS:  A comprehensive review of systems was negative.   PHYSICAL  EXAMINATION: General appearance: alert, cooperative and no distress Head: Normocephalic, without obvious abnormality, atraumatic Neck: no adenopathy Lymph nodes: Cervical, supraclavicular, and axillary nodes normal. Resp: clear to auscultation bilaterally Cardio: regular rate and rhythm, S1, S2 normal, no murmur, click, rub or gallop GI: soft, non-tender; bowel sounds normal; no masses,  no organomegaly Extremities: extremities normal, atraumatic, no cyanosis or edema  ECOG PERFORMANCE STATUS: 1 - Symptomatic but completely ambulatory  Blood pressure 119/70, pulse 86, temperature 97.2 F (36.2 C), temperature source Oral, resp. rate 20, height 5\' 2"  (1.575 m), weight 120 lb (54.432 kg).  LABORATORY DATA: Lab Results  Component Value Date   WBC 5.8 01/12/2013   HGB 13.9 01/12/2013   HCT 40.3 01/12/2013   MCV 100.4 01/12/2013   PLT 303 01/12/2013      Chemistry      Component Value Date/Time   NA 141 01/12/2013 1140   NA 142 01/04/2012 1029   NA 139 02/10/2011 2350   K 4.6 01/12/2013 1140   K 4.0 01/04/2012 1029   K 3.7 02/10/2011 2350   CL 101 01/12/2013 1140   CL 97* 01/04/2012 1029   CL 100 02/10/2011 2350   CO2 31* 01/12/2013 1140   CO2 31 01/04/2012 1029   CO2 26 02/10/2011 2350   BUN 12.4 01/12/2013 1140   BUN 11 01/04/2012 1029   BUN 17 02/10/2011 2350   CREATININE 0.8  01/12/2013 1140   CREATININE 1.0 01/04/2012 1029   CREATININE 0.77 02/10/2011 2350      Component Value Date/Time   CALCIUM 9.9 01/12/2013 1140   CALCIUM 9.1 01/04/2012 1029   CALCIUM 9.5 02/10/2011 2350   ALKPHOS 37* 01/12/2013 1140   ALKPHOS 33 01/04/2012 1029   ALKPHOS 43 06/28/2010 0907   AST 33 01/12/2013 1140   AST 33 01/04/2012 1029   AST 60* 06/28/2010 0907   ALT 21 01/12/2013 1140   ALT 34 06/28/2010 0907   BILITOT 0.97 01/12/2013 1140   BILITOT 1.00 01/04/2012 1029   BILITOT 1.3* 06/28/2010 0907       RADIOGRAPHIC STUDIES: Ct Chest Wo Contrast  01/12/2013  *RADIOLOGY REPORT*  Clinical Data: Right lung cancer diagnosed 2001  status post right upper lobectomy.  The subsequent left lung cancer diagnosed 2007, status post left upper lobectomy.  CT CHEST WITHOUT CONTRAST  Technique:  Multidetector CT imaging of the chest was performed following the standard protocol without IV contrast.  Comparison: 01/04/2012  Findings: Previously described right thyroid nodule not well seen today, likely secondary to lack of intravenous contrast.  No axillary lymphadenopathy.  No mediastinal or hilar lymphadenopathy.  Ascending aorta measures 3.6 cm in maximum diameter.  Heart size is normal.  There is no pericardial or pleural effusion.  Lungs appear hyperexpanded bilaterally, consistent with the reported history of bilateral upper lobectomy.  No areas of focal airspace consolidation.  No pulmonary edema.  No parenchymal nodule or mass. The tiny peripheral nodular density seen in the right lung previously is unchanged today on image 21.  Bone windows reveal no worrisome lytic or sclerotic osseous lesions.  IMPRESSION: Stable exam.  No new or progressive findings.   Original Report Authenticated By: Kennith Center, M.D.     ASSESSMENT: This is a very pleasant 72 years old white female with history of stage IB non-small cell lung cancer status post left lower lobectomy in 2007. She also has a history of stage IA non-small cell lung cancer status post right upper lobectomy 1999. The patient has no evidence for disease recurrence on the recent scan.   PLAN: I discussed the scan results with the patient. I recommended for her to continue on observation with repeat CT scan of the chest without contrast in one year. She was advised to call me immediately ifshe has any concerning symptoms in the interval.  All questions were answered. The patient knows to call the clinic with any problems, questions or concerns. We can certainly see the patient much sooner if necessary.

## 2013-01-19 NOTE — Patient Instructions (Addendum)
No evidence for disease recurrence on the recent scan.  Followup visit in one year. 

## 2013-06-03 ENCOUNTER — Other Ambulatory Visit: Payer: Self-pay

## 2013-06-03 DIAGNOSIS — Z1231 Encounter for screening mammogram for malignant neoplasm of breast: Secondary | ICD-10-CM

## 2013-07-06 ENCOUNTER — Ambulatory Visit
Admission: RE | Admit: 2013-07-06 | Discharge: 2013-07-06 | Disposition: A | Discharge status: Home / Self Care | Payer: Medicare Other | Source: Ambulatory Visit

## 2013-07-06 DIAGNOSIS — Z1231 Encounter for screening mammogram for malignant neoplasm of breast: Secondary | ICD-10-CM

## 2014-01-18 ENCOUNTER — Other Ambulatory Visit (HOSPITAL_BASED_OUTPATIENT_CLINIC_OR_DEPARTMENT_OTHER): Payer: Medicare Other

## 2014-01-18 ENCOUNTER — Ambulatory Visit (HOSPITAL_COMMUNITY)
Admission: RE | Admit: 2014-01-18 | Discharge: 2014-01-18 | Disposition: A | Discharge status: Home / Self Care | Payer: Medicare Other | Source: Ambulatory Visit | Attending: Internal Medicine | Admitting: Internal Medicine

## 2014-01-18 ENCOUNTER — Encounter (HOSPITAL_COMMUNITY): Payer: Self-pay

## 2014-01-18 DIAGNOSIS — Z85118 Personal history of other malignant neoplasm of bronchus and lung: Secondary | ICD-10-CM | POA: Insufficient documentation

## 2014-01-18 DIAGNOSIS — C3490 Malignant neoplasm of unspecified part of unspecified bronchus or lung: Secondary | ICD-10-CM

## 2014-01-18 LAB — COMPREHENSIVE METABOLIC PANEL (CC13)
ALT: 14 U/L (ref 0–55)
ANION GAP: 13 meq/L — AB (ref 3–11)
AST: 25 U/L (ref 5–34)
Albumin: 3.8 g/dL (ref 3.5–5.0)
Alkaline Phosphatase: 34 U/L — ABNORMAL LOW (ref 40–150)
BILIRUBIN TOTAL: 1.57 mg/dL — AB (ref 0.20–1.20)
BUN: 15.4 mg/dL (ref 7.0–26.0)
CALCIUM: 9.8 mg/dL (ref 8.4–10.4)
CHLORIDE: 103 meq/L (ref 98–109)
CO2: 27 meq/L (ref 22–29)
CREATININE: 0.9 mg/dL (ref 0.6–1.1)
Glucose: 93 mg/dl (ref 70–140)
Potassium: 4.1 mEq/L (ref 3.5–5.1)
Sodium: 142 mEq/L (ref 136–145)
Total Protein: 7.1 g/dL (ref 6.4–8.3)

## 2014-01-18 LAB — CBC WITH DIFFERENTIAL/PLATELET
BASO%: 1.1 % (ref 0.0–2.0)
BASOS ABS: 0 10*3/uL (ref 0.0–0.1)
EOS%: 3 % (ref 0.0–7.0)
Eosinophils Absolute: 0.1 10*3/uL (ref 0.0–0.5)
HEMATOCRIT: 41.8 % (ref 34.8–46.6)
HEMOGLOBIN: 13.7 g/dL (ref 11.6–15.9)
LYMPH#: 1.2 10*3/uL (ref 0.9–3.3)
LYMPH%: 27.1 % (ref 14.0–49.7)
MCH: 32.9 pg (ref 25.1–34.0)
MCHC: 32.7 g/dL (ref 31.5–36.0)
MCV: 100.6 fL (ref 79.5–101.0)
MONO#: 0.5 10*3/uL (ref 0.1–0.9)
MONO%: 11.4 % (ref 0.0–14.0)
NEUT#: 2.5 10*3/uL (ref 1.5–6.5)
NEUT%: 57.4 % (ref 38.4–76.8)
PLATELETS: 222 10*3/uL (ref 145–400)
RBC: 4.16 10*6/uL (ref 3.70–5.45)
RDW: 15.9 % — ABNORMAL HIGH (ref 11.2–14.5)
WBC: 4.4 10*3/uL (ref 3.9–10.3)

## 2014-01-20 ENCOUNTER — Telehealth: Payer: Self-pay | Admitting: Internal Medicine

## 2014-01-20 ENCOUNTER — Ambulatory Visit: Payer: Medicare Other | Admitting: Internal Medicine

## 2014-01-20 NOTE — Telephone Encounter (Signed)
returned pt call and s.w pt and r/s todays appt to 5.19.15.Marland KitchenMarland Kitchenpt ok and aware

## 2014-01-26 ENCOUNTER — Ambulatory Visit (HOSPITAL_BASED_OUTPATIENT_CLINIC_OR_DEPARTMENT_OTHER): Payer: Medicare Other | Admitting: Internal Medicine

## 2014-01-26 ENCOUNTER — Encounter: Payer: Self-pay | Admitting: Internal Medicine

## 2014-01-26 VITALS — BP 149/82 | HR 74 | Temp 98.2°F | Resp 18 | Ht 62.0 in | Wt 125.0 lb

## 2014-01-26 DIAGNOSIS — C3490 Malignant neoplasm of unspecified part of unspecified bronchus or lung: Secondary | ICD-10-CM

## 2014-01-26 DIAGNOSIS — Z85118 Personal history of other malignant neoplasm of bronchus and lung: Secondary | ICD-10-CM

## 2014-01-26 NOTE — Progress Notes (Signed)
[name] [phone]   Fax:(336) [place], [place] [address]  PRINCIPAL DIAGNOSIS:  1. Stage IB (T2 N0 M0) non-small cell lung cancer diagnosed in February of 2007. 2. History of stage IA non-small cell lung cancer diagnosed in 1999.  PRIOR THERAPY:  1. Status post right upper lobectomy in 1999. 2. Status post left lower lobectomy in February 2007 under the care of Dr. Arlyce Dice.  CURRENT THERAPY: Observation.  INTERVAL HISTORY: [name] 73 y.o. female returns to the clinic today for annual followup visit. The patient is feeling fine today with no specific complaints. She denied having any significant chest pain, shortness of breath, cough or hemoptysis. She denied having any significant weight loss or night sweats. The patient had repeat CT scan of the chest performed recently and she is here for evaluation and discussion of her scan results.  MEDICAL HISTORY: Past Medical History  Diagnosis Date  . rt lung ca dx'd 2001    ru-lobectomy  . lt lung ca dx'd 2007    lu-lobectomy    ALLERGIES:  is allergic to sulfa antibiotics.  MEDICATIONS:  Current Outpatient Prescriptions  Medication Sig Dispense Refill  . acetaminophen (TYLENOL) 500 MG tablet Take 500 mg by mouth every 6 (six) hours as needed for pain.      Marland Kitchen aspirin 81 MG tablet Take 81 mg by mouth daily.      Marland Kitchen atorvastatin (LIPITOR) 20 MG tablet Take 20 mg by mouth daily.      . Cholecalciferol (VITAMIN D PO) Take 2 tablets by mouth daily.      . hydrochlorothiazide (HYDRODIURIL) 25 MG tablet Take 25 mg by mouth daily.      . potassium chloride SA (K-DUR,KLOR-CON) 20 MEQ tablet Take 20 mEq by mouth daily.       No current facility-administered medications for this visit.    REVIEW OF SYSTEMS:  A comprehensive review of systems was negative.   PHYSICAL EXAMINATION: General appearance: alert,  cooperative and no distress Head: Normocephalic, without obvious abnormality, atraumatic Neck: no adenopathy Lymph nodes: Cervical, supraclavicular, and axillary nodes normal. Resp: clear to auscultation bilaterally Cardio: regular rate and rhythm, S1, S2 normal, no murmur, click, rub or gallop GI: soft, non-tender; bowel sounds normal; no masses,  no organomegaly Extremities: extremities normal, atraumatic, no cyanosis or edema  ECOG PERFORMANCE STATUS: 1 - Symptomatic but completely ambulatory  Blood pressure 149/82, pulse 74, temperature 98.2 F (36.8 C), temperature source Oral, resp. rate 18, height 5\' 2"  (1.575 m), weight 125 lb (56.7 kg).  LABORATORY DATA: Lab Results  Component Value Date   WBC 4.4 01/18/2014   HGB 13.7 01/18/2014   HCT 41.8 01/18/2014   MCV 100.6 01/18/2014   PLT 222 01/18/2014      Chemistry      Component Value Date/Time   NA 142 01/18/2014 0907   NA 142 01/04/2012 1029   NA 139 02/10/2011 2350   K 4.1 01/18/2014 0907   K 4.0 01/04/2012 1029   K 3.7 02/10/2011 2350   CL 101 01/12/2013 1140   CL 97* 01/04/2012 1029   CL 100 02/10/2011 2350   CO2 27 01/18/2014 0907   CO2 31 01/04/2012 1029   CO2 26 02/10/2011 2350   BUN 15.4 01/18/2014 0907   BUN 11 01/04/2012 1029   BUN 17 02/10/2011 2350   CREATININE 0.9 01/18/2014 0907   CREATININE  1.0 01/04/2012 1029   CREATININE 0.77 02/10/2011 2350      Component Value Date/Time   CALCIUM 9.8 01/18/2014 0907   CALCIUM 9.1 01/04/2012 1029   CALCIUM 9.5 02/10/2011 2350   ALKPHOS 34* 01/18/2014 0907   ALKPHOS 33 01/04/2012 1029   ALKPHOS 43 06/28/2010 0907   AST 25 01/18/2014 0907   AST 33 01/04/2012 1029   AST 60* 06/28/2010 0907   ALT 14 01/18/2014 0907   ALT 21 01/04/2012 1029   ALT 34 06/28/2010 0907   BILITOT 1.57* 01/18/2014 0907   BILITOT 1.00 01/04/2012 1029   BILITOT 1.3* 06/28/2010 0907       RADIOGRAPHIC STUDIES: Ct Chest Wo Contrast  01/18/2014   CLINICAL DATA:  Right-sided lung cancer diagnosed 2001 and left-sided  lung cancer diagnosed 2007. Status post surgical resection without chemotherapy or radiation therapy.  EXAM: CT CHEST WITHOUT CONTRAST  TECHNIQUE: Multidetector CT imaging of the chest was performed following the standard protocol without IV contrast.  COMPARISON:  01/12/2013  FINDINGS: Stable degree of ascending aortic ectasia measuring 3.7 cm at the level of the main pulmonary artery image 28. Mild atheromatous aortic calcification is noted the of focal aneurysm. Heart size is normal. Trace fluid in the superior pericardial recess is reidentified. Allowing for lack of IV contrast which may decrease conspicuity of mediastinal/ hilar are nodes, no lymphadenopathy is identified. Small representative pretracheal node measures 0.5 cm image 26, stable.  Incomplete imaging of the upper abdomen demonstrates nonobstructing 2 mm left upper renal pole calculus image 58 and normal-appearing adrenal glands. Emphysematous changes are noted bilaterally. Bilateral thoracotomy changes are reidentified.  Scarring within the right middle lobe is noted with evidence of previous right lower lobectomy and left lower lobectomy. No mass at the resection margins. No new pulmonary nodule, mass, or consolidation. No pleural effusion. Central airways are patent.  No acute osseous abnormality. Multilevel disc degenerative change is noted. Vertebral body heights are preserved. No lytic or sclerotic osseous lesion.  IMPRESSION: Postsurgical changes as above without CT evidence for lung cancer recurrence or intrathoracic metastatic disease.   Electronically Signed   By: Conchita Paris M.D.   On: 01/18/2014 11:01   ASSESSMENT AND PLAN: This is a very pleasant 73 years old white female with history of stage IB non-small cell lung cancer status post left lower lobectomy in 2007. She also has a history of stage IA non-small cell lung cancer status post right upper lobectomy 1999. The patient has no evidence for disease recurrence on the recent  scan. She is now more than 8 years since her last diagnosis of lung cancer with no evidence for recurrence. I discussed the scan results with the patient. I recommended for the patient to continue on observation with regular followup visits by her primary care physician. I will see the patient on as-needed basis at this point. She was advised to call me immediately if she has any concerning symptoms.  All questions were answered. The patient knows to call the clinic with any problems, questions or concerns. We can certainly see the patient much sooner if necessary.  Disclaimer: This note was dictated with voice recognition software. Similar sounding words can inadvertently be transcribed and may not be corrected upon review.

## 2014-06-08 ENCOUNTER — Other Ambulatory Visit: Payer: Self-pay

## 2014-06-08 DIAGNOSIS — Z1231 Encounter for screening mammogram for malignant neoplasm of breast: Secondary | ICD-10-CM

## 2014-07-07 ENCOUNTER — Ambulatory Visit
Admission: RE | Admit: 2014-07-07 | Discharge: 2014-07-07 | Disposition: A | Discharge status: Home / Self Care | Payer: Medicare Other | Source: Ambulatory Visit

## 2014-07-07 DIAGNOSIS — Z1231 Encounter for screening mammogram for malignant neoplasm of breast: Secondary | ICD-10-CM

## 2014-09-23 DIAGNOSIS — S239XXA Sprain of unspecified parts of thorax, initial encounter: Secondary | ICD-10-CM | POA: Diagnosis not present

## 2014-09-23 DIAGNOSIS — M545 Low back pain: Secondary | ICD-10-CM | POA: Diagnosis not present

## 2015-02-23 DIAGNOSIS — M549 Dorsalgia, unspecified: Secondary | ICD-10-CM | POA: Diagnosis not present

## 2015-02-23 DIAGNOSIS — M47812 Spondylosis without myelopathy or radiculopathy, cervical region: Secondary | ICD-10-CM | POA: Diagnosis not present

## 2015-02-23 DIAGNOSIS — M5136 Other intervertebral disc degeneration, lumbar region: Secondary | ICD-10-CM | POA: Diagnosis not present

## 2015-02-23 DIAGNOSIS — M542 Cervicalgia: Secondary | ICD-10-CM | POA: Diagnosis not present

## 2015-03-02 DIAGNOSIS — J441 Chronic obstructive pulmonary disease with (acute) exacerbation: Secondary | ICD-10-CM | POA: Diagnosis not present

## 2015-03-02 DIAGNOSIS — E782 Mixed hyperlipidemia: Secondary | ICD-10-CM | POA: Diagnosis not present

## 2015-03-02 DIAGNOSIS — I10 Essential (primary) hypertension: Secondary | ICD-10-CM | POA: Diagnosis not present

## 2015-03-02 DIAGNOSIS — Z0000 Encounter for general adult medical examination without abnormal findings: Secondary | ICD-10-CM | POA: Diagnosis not present

## 2015-03-09 DIAGNOSIS — E782 Mixed hyperlipidemia: Secondary | ICD-10-CM | POA: Diagnosis not present

## 2015-03-09 DIAGNOSIS — M150 Primary generalized (osteo)arthritis: Secondary | ICD-10-CM | POA: Diagnosis not present

## 2015-03-09 DIAGNOSIS — I10 Essential (primary) hypertension: Secondary | ICD-10-CM | POA: Diagnosis not present

## 2015-03-09 DIAGNOSIS — Z0000 Encounter for general adult medical examination without abnormal findings: Secondary | ICD-10-CM | POA: Diagnosis not present

## 2015-04-11 DIAGNOSIS — H2513 Age-related nuclear cataract, bilateral: Secondary | ICD-10-CM | POA: Diagnosis not present

## 2015-05-30 DIAGNOSIS — M6281 Muscle weakness (generalized): Secondary | ICD-10-CM | POA: Diagnosis not present

## 2015-05-30 DIAGNOSIS — M5416 Radiculopathy, lumbar region: Secondary | ICD-10-CM | POA: Diagnosis not present

## 2015-05-30 DIAGNOSIS — M5136 Other intervertebral disc degeneration, lumbar region: Secondary | ICD-10-CM | POA: Diagnosis not present

## 2015-06-03 ENCOUNTER — Other Ambulatory Visit: Payer: Self-pay | Admitting: Internal Medicine

## 2015-06-03 DIAGNOSIS — M5136 Other intervertebral disc degeneration, lumbar region: Secondary | ICD-10-CM

## 2015-06-06 ENCOUNTER — Ambulatory Visit
Admission: RE | Admit: 2015-06-06 | Discharge: 2015-06-06 | Disposition: A | Discharge status: Home / Self Care | Payer: Medicare Other | Source: Ambulatory Visit | Attending: Internal Medicine | Admitting: Internal Medicine

## 2015-06-06 ENCOUNTER — Other Ambulatory Visit: Payer: Self-pay

## 2015-06-06 DIAGNOSIS — M5136 Other intervertebral disc degeneration, lumbar region: Secondary | ICD-10-CM

## 2015-06-06 DIAGNOSIS — Z1231 Encounter for screening mammogram for malignant neoplasm of breast: Secondary | ICD-10-CM

## 2015-06-06 DIAGNOSIS — M545 Low back pain: Secondary | ICD-10-CM | POA: Diagnosis not present

## 2015-06-06 MED ORDER — GADOBENATE DIMEGLUMINE 529 MG/ML IV SOLN: 10.0000 mL | Freq: Once | INTRAVENOUS | Status: DC | PRN

## 2015-06-09 DIAGNOSIS — M5136 Other intervertebral disc degeneration, lumbar region: Secondary | ICD-10-CM | POA: Diagnosis not present

## 2015-06-09 DIAGNOSIS — Z23 Encounter for immunization: Secondary | ICD-10-CM | POA: Diagnosis not present

## 2015-06-14 ENCOUNTER — Other Ambulatory Visit: Payer: Self-pay

## 2015-07-11 ENCOUNTER — Ambulatory Visit: Payer: Medicare Other

## 2015-07-27 ENCOUNTER — Other Ambulatory Visit: Payer: Self-pay | Admitting: Internal Medicine

## 2015-07-27 DIAGNOSIS — M5136 Other intervertebral disc degeneration, lumbar region: Secondary | ICD-10-CM

## 2015-08-03 ENCOUNTER — Ambulatory Visit
Admission: RE | Admit: 2015-08-03 | Discharge: 2015-08-03 | Disposition: A | Discharge status: Home / Self Care | Payer: Medicare Other | Source: Ambulatory Visit | Attending: Internal Medicine | Admitting: Internal Medicine

## 2015-08-03 DIAGNOSIS — M5136 Other intervertebral disc degeneration, lumbar region: Secondary | ICD-10-CM

## 2015-08-03 DIAGNOSIS — M545 Low back pain: Secondary | ICD-10-CM | POA: Diagnosis not present

## 2015-08-03 DIAGNOSIS — M4326 Fusion of spine, lumbar region: Secondary | ICD-10-CM | POA: Diagnosis not present

## 2015-08-03 MED ORDER — METHYLPREDNISOLONE ACETATE 40 MG/ML INJ SUSP (RADIOLOG
120.0000 mg | Freq: Once | INTRAMUSCULAR | Status: AC
  Administered 2015-08-03: 120 mg via EPIDURAL

## 2015-08-03 MED ORDER — IOHEXOL 180 MG/ML  SOLN
1.0000 mL | Freq: Once | INTRAMUSCULAR | Status: DC | PRN
  Administered 2015-08-03: 1 mL via EPIDURAL

## 2015-08-03 NOTE — Discharge Instructions (Signed)

## 2015-09-14 ENCOUNTER — Ambulatory Visit
Admission: RE | Admit: 2015-09-14 | Discharge: 2015-09-14 | Disposition: A | Discharge status: Home / Self Care | Payer: Medicare Other | Source: Ambulatory Visit

## 2015-09-14 DIAGNOSIS — Z1231 Encounter for screening mammogram for malignant neoplasm of breast: Secondary | ICD-10-CM | POA: Diagnosis not present

## 2015-09-21 DIAGNOSIS — E782 Mixed hyperlipidemia: Secondary | ICD-10-CM | POA: Diagnosis not present

## 2015-09-21 DIAGNOSIS — I10 Essential (primary) hypertension: Secondary | ICD-10-CM | POA: Diagnosis not present

## 2015-09-21 DIAGNOSIS — M150 Primary generalized (osteo)arthritis: Secondary | ICD-10-CM | POA: Diagnosis not present

## 2015-09-28 DIAGNOSIS — E782 Mixed hyperlipidemia: Secondary | ICD-10-CM | POA: Diagnosis not present

## 2015-09-28 DIAGNOSIS — M150 Primary generalized (osteo)arthritis: Secondary | ICD-10-CM | POA: Diagnosis not present

## 2015-09-28 DIAGNOSIS — M5136 Other intervertebral disc degeneration, lumbar region: Secondary | ICD-10-CM | POA: Diagnosis not present

## 2015-09-28 DIAGNOSIS — I10 Essential (primary) hypertension: Secondary | ICD-10-CM | POA: Diagnosis not present

## 2015-11-08 DIAGNOSIS — R600 Localized edema: Secondary | ICD-10-CM | POA: Diagnosis not present

## 2015-11-08 DIAGNOSIS — N390 Urinary tract infection, site not specified: Secondary | ICD-10-CM | POA: Diagnosis not present

## 2015-11-15 DIAGNOSIS — R000 Tachycardia, unspecified: Secondary | ICD-10-CM | POA: Diagnosis not present

## 2015-11-15 DIAGNOSIS — N390 Urinary tract infection, site not specified: Secondary | ICD-10-CM | POA: Diagnosis not present

## 2015-11-15 DIAGNOSIS — R600 Localized edema: Secondary | ICD-10-CM | POA: Diagnosis not present

## 2015-11-28 DIAGNOSIS — N390 Urinary tract infection, site not specified: Secondary | ICD-10-CM | POA: Diagnosis not present

## 2015-12-05 DIAGNOSIS — N390 Urinary tract infection, site not specified: Secondary | ICD-10-CM | POA: Diagnosis not present

## 2015-12-05 DIAGNOSIS — R600 Localized edema: Secondary | ICD-10-CM | POA: Diagnosis not present

## 2015-12-05 DIAGNOSIS — R000 Tachycardia, unspecified: Secondary | ICD-10-CM | POA: Diagnosis not present

## 2015-12-14 ENCOUNTER — Other Ambulatory Visit: Payer: Self-pay | Admitting: Internal Medicine

## 2015-12-14 DIAGNOSIS — R600 Localized edema: Secondary | ICD-10-CM | POA: Diagnosis not present

## 2015-12-14 DIAGNOSIS — M5136 Other intervertebral disc degeneration, lumbar region: Secondary | ICD-10-CM | POA: Diagnosis not present

## 2015-12-14 DIAGNOSIS — R000 Tachycardia, unspecified: Secondary | ICD-10-CM | POA: Diagnosis not present

## 2015-12-21 ENCOUNTER — Ambulatory Visit
Admission: RE | Admit: 2015-12-21 | Discharge: 2015-12-21 | Disposition: A | Discharge status: Home / Self Care | Payer: Medicare Other | Source: Ambulatory Visit | Attending: Internal Medicine | Admitting: Internal Medicine

## 2015-12-21 DIAGNOSIS — M545 Low back pain: Secondary | ICD-10-CM | POA: Diagnosis not present

## 2015-12-21 DIAGNOSIS — M5136 Other intervertebral disc degeneration, lumbar region: Secondary | ICD-10-CM

## 2015-12-21 MED ORDER — METHYLPREDNISOLONE ACETATE 40 MG/ML INJ SUSP (RADIOLOG
120.0000 mg | Freq: Once | INTRAMUSCULAR | Status: AC
  Administered 2015-12-21: 120 mg via EPIDURAL

## 2015-12-21 MED ORDER — IOHEXOL 180 MG/ML  SOLN
1.0000 mL | Freq: Once | INTRAMUSCULAR | Status: AC | PRN
  Administered 2015-12-21: 1 mL via EPIDURAL

## 2015-12-21 NOTE — Discharge Instructions (Signed)

## 2016-03-28 DIAGNOSIS — N390 Urinary tract infection, site not specified: Secondary | ICD-10-CM | POA: Diagnosis not present

## 2016-03-28 DIAGNOSIS — E782 Mixed hyperlipidemia: Secondary | ICD-10-CM | POA: Diagnosis not present

## 2016-03-28 DIAGNOSIS — I10 Essential (primary) hypertension: Secondary | ICD-10-CM | POA: Diagnosis not present

## 2016-03-28 DIAGNOSIS — Z0000 Encounter for general adult medical examination without abnormal findings: Secondary | ICD-10-CM | POA: Diagnosis not present

## 2016-03-28 DIAGNOSIS — M150 Primary generalized (osteo)arthritis: Secondary | ICD-10-CM | POA: Diagnosis not present

## 2016-03-28 DIAGNOSIS — Z780 Asymptomatic menopausal state: Secondary | ICD-10-CM | POA: Diagnosis not present

## 2016-03-28 DIAGNOSIS — M8580 Other specified disorders of bone density and structure, unspecified site: Secondary | ICD-10-CM | POA: Diagnosis not present

## 2016-03-28 DIAGNOSIS — R748 Abnormal levels of other serum enzymes: Secondary | ICD-10-CM | POA: Diagnosis not present

## 2016-04-04 DIAGNOSIS — I10 Essential (primary) hypertension: Secondary | ICD-10-CM | POA: Diagnosis not present

## 2016-04-04 DIAGNOSIS — R7989 Other specified abnormal findings of blood chemistry: Secondary | ICD-10-CM | POA: Diagnosis not present

## 2016-04-04 DIAGNOSIS — M150 Primary generalized (osteo)arthritis: Secondary | ICD-10-CM | POA: Diagnosis not present

## 2016-04-04 DIAGNOSIS — E782 Mixed hyperlipidemia: Secondary | ICD-10-CM | POA: Diagnosis not present

## 2016-04-05 ENCOUNTER — Other Ambulatory Visit: Payer: Self-pay | Admitting: Internal Medicine

## 2016-04-05 DIAGNOSIS — M5136 Other intervertebral disc degeneration, lumbar region: Secondary | ICD-10-CM

## 2016-04-17 ENCOUNTER — Other Ambulatory Visit: Payer: Medicare Other

## 2016-04-18 ENCOUNTER — Ambulatory Visit
Admission: RE | Admit: 2016-04-18 | Discharge: 2016-04-18 | Disposition: A | Discharge status: Home / Self Care | Payer: Medicare Other | Source: Ambulatory Visit | Attending: Internal Medicine | Admitting: Internal Medicine

## 2016-04-18 DIAGNOSIS — M5136 Other intervertebral disc degeneration, lumbar region: Secondary | ICD-10-CM

## 2016-04-18 DIAGNOSIS — M47817 Spondylosis without myelopathy or radiculopathy, lumbosacral region: Secondary | ICD-10-CM | POA: Diagnosis not present

## 2016-04-18 MED ORDER — IOPAMIDOL (ISOVUE-M 200) INJECTION 41%
1.0000 mL | Freq: Once | INTRAMUSCULAR | Status: AC
Start: 2016-04-18 — End: 2016-04-18
  Administered 2016-04-18: 1 mL via EPIDURAL

## 2016-04-18 MED ORDER — METHYLPREDNISOLONE ACETATE 40 MG/ML INJ SUSP (RADIOLOG
120.0000 mg | Freq: Once | INTRAMUSCULAR | Status: AC
Start: 2016-04-18 — End: 2016-04-18
  Administered 2016-04-18: 120 mg via EPIDURAL

## 2016-04-18 NOTE — Discharge Instructions (Signed)

## 2016-05-23 DIAGNOSIS — Z23 Encounter for immunization: Secondary | ICD-10-CM | POA: Diagnosis not present

## 2016-05-23 DIAGNOSIS — R29898 Other symptoms and signs involving the musculoskeletal system: Secondary | ICD-10-CM | POA: Diagnosis not present

## 2016-05-28 DIAGNOSIS — H2513 Age-related nuclear cataract, bilateral: Secondary | ICD-10-CM | POA: Diagnosis not present

## 2016-06-05 DIAGNOSIS — R200 Anesthesia of skin: Secondary | ICD-10-CM | POA: Diagnosis not present

## 2016-06-05 DIAGNOSIS — R29898 Other symptoms and signs involving the musculoskeletal system: Secondary | ICD-10-CM | POA: Diagnosis not present

## 2016-06-07 ENCOUNTER — Other Ambulatory Visit: Payer: Self-pay | Admitting: Neurosurgery

## 2016-06-07 DIAGNOSIS — R29898 Other symptoms and signs involving the musculoskeletal system: Secondary | ICD-10-CM

## 2016-06-08 ENCOUNTER — Other Ambulatory Visit: Payer: Self-pay | Admitting: Neurosurgery

## 2016-06-08 DIAGNOSIS — R200 Anesthesia of skin: Secondary | ICD-10-CM

## 2016-06-19 ENCOUNTER — Ambulatory Visit
Admission: RE | Admit: 2016-06-19 | Discharge: 2016-06-19 | Disposition: A | Discharge status: Home / Self Care | Payer: Medicare Other | Source: Ambulatory Visit | Attending: Neurosurgery | Admitting: Neurosurgery

## 2016-06-19 DIAGNOSIS — R29898 Other symptoms and signs involving the musculoskeletal system: Secondary | ICD-10-CM

## 2016-06-19 DIAGNOSIS — M5127 Other intervertebral disc displacement, lumbosacral region: Secondary | ICD-10-CM | POA: Diagnosis not present

## 2016-06-19 MED ORDER — ONDANSETRON HCL 4 MG/2ML IJ SOLN: 4.0000 mg | Freq: Four times a day (QID) | INTRAMUSCULAR | Status: DC | PRN

## 2016-06-19 MED ORDER — DIAZEPAM 5 MG PO TABS
5.0000 mg | ORAL_TABLET | Freq: Once | ORAL | Status: AC
  Administered 2016-06-19: 5 mg via ORAL

## 2016-06-19 MED ORDER — DIAZEPAM 5 MG PO TABS: 10.0000 mg | ORAL_TABLET | Freq: Once | ORAL | Status: DC

## 2016-06-19 MED ORDER — IOPAMIDOL (ISOVUE-M 200) INJECTION 41%
18.0000 mL | Freq: Once | INTRAMUSCULAR | Status: AC
  Administered 2016-06-19: 18 mL via INTRATHECAL

## 2016-06-19 NOTE — Discharge Instructions (Signed)

## 2016-06-22 ENCOUNTER — Ambulatory Visit
Admission: RE | Admit: 2016-06-22 | Discharge: 2016-06-22 | Disposition: A | Discharge status: Home / Self Care | Payer: Medicare Other | Source: Ambulatory Visit | Attending: Neurosurgery | Admitting: Neurosurgery

## 2016-06-22 ENCOUNTER — Telehealth: Payer: Self-pay

## 2016-06-22 DIAGNOSIS — M50221 Other cervical disc displacement at C4-C5 level: Secondary | ICD-10-CM | POA: Diagnosis not present

## 2016-06-22 DIAGNOSIS — R200 Anesthesia of skin: Secondary | ICD-10-CM

## 2016-06-22 NOTE — Telephone Encounter (Signed)
Spoke with patient after her myelogram here 06/19/16.  She states she was sore after the procedure but is okay now.  Denies headache.  She complimented all of [place] on how nice we were to her and her driver.  jkl

## 2016-07-05 DIAGNOSIS — R29898 Other symptoms and signs involving the musculoskeletal system: Secondary | ICD-10-CM | POA: Diagnosis not present

## 2016-07-06 ENCOUNTER — Observation Stay (HOSPITAL_COMMUNITY): Payer: Medicare Other

## 2016-07-06 ENCOUNTER — Inpatient Hospital Stay (HOSPITAL_COMMUNITY)
Admission: EM | Admit: 2016-07-06 | Discharge: 2016-07-09 | DRG: 641 | Disposition: A | Discharge status: Skilled Nursing Facility | Payer: Medicare Other | Attending: Internal Medicine | Admitting: Internal Medicine

## 2016-07-06 ENCOUNTER — Encounter (HOSPITAL_COMMUNITY): Payer: Self-pay | Admitting: *Deleted

## 2016-07-06 DIAGNOSIS — I959 Hypotension, unspecified: Secondary | ICD-10-CM | POA: Diagnosis not present

## 2016-07-06 DIAGNOSIS — E860 Dehydration: Principal | ICD-10-CM | POA: Diagnosis present

## 2016-07-06 DIAGNOSIS — K760 Fatty (change of) liver, not elsewhere classified: Secondary | ICD-10-CM | POA: Diagnosis not present

## 2016-07-06 DIAGNOSIS — Z902 Acquired absence of lung [part of]: Secondary | ICD-10-CM | POA: Diagnosis not present

## 2016-07-06 DIAGNOSIS — R29898 Other symptoms and signs involving the musculoskeletal system: Secondary | ICD-10-CM | POA: Diagnosis present

## 2016-07-06 DIAGNOSIS — E785 Hyperlipidemia, unspecified: Secondary | ICD-10-CM | POA: Diagnosis not present

## 2016-07-06 DIAGNOSIS — N183 Chronic kidney disease, stage 3 unspecified: Secondary | ICD-10-CM | POA: Diagnosis present

## 2016-07-06 DIAGNOSIS — D638 Anemia in other chronic diseases classified elsewhere: Secondary | ICD-10-CM | POA: Diagnosis not present

## 2016-07-06 DIAGNOSIS — J449 Chronic obstructive pulmonary disease, unspecified: Secondary | ICD-10-CM | POA: Diagnosis not present

## 2016-07-06 DIAGNOSIS — R238 Other skin changes: Secondary | ICD-10-CM | POA: Diagnosis present

## 2016-07-06 DIAGNOSIS — I129 Hypertensive chronic kidney disease with stage 1 through stage 4 chronic kidney disease, or unspecified chronic kidney disease: Secondary | ICD-10-CM | POA: Diagnosis not present

## 2016-07-06 DIAGNOSIS — M4802 Spinal stenosis, cervical region: Secondary | ICD-10-CM | POA: Diagnosis present

## 2016-07-06 DIAGNOSIS — E871 Hypo-osmolality and hyponatremia: Secondary | ICD-10-CM | POA: Diagnosis not present

## 2016-07-06 DIAGNOSIS — Z87891 Personal history of nicotine dependence: Secondary | ICD-10-CM

## 2016-07-06 DIAGNOSIS — Z681 Body mass index (BMI) 19 or less, adult: Secondary | ICD-10-CM

## 2016-07-06 DIAGNOSIS — Z9889 Other specified postprocedural states: Secondary | ICD-10-CM | POA: Diagnosis not present

## 2016-07-06 DIAGNOSIS — E46 Unspecified protein-calorie malnutrition: Secondary | ICD-10-CM | POA: Diagnosis present

## 2016-07-06 DIAGNOSIS — Z79899 Other long term (current) drug therapy: Secondary | ICD-10-CM | POA: Diagnosis not present

## 2016-07-06 DIAGNOSIS — G8929 Other chronic pain: Secondary | ICD-10-CM | POA: Diagnosis not present

## 2016-07-06 DIAGNOSIS — N390 Urinary tract infection, site not specified: Secondary | ICD-10-CM | POA: Diagnosis not present

## 2016-07-06 DIAGNOSIS — R638 Other symptoms and signs concerning food and fluid intake: Secondary | ICD-10-CM | POA: Diagnosis not present

## 2016-07-06 DIAGNOSIS — M549 Dorsalgia, unspecified: Secondary | ICD-10-CM | POA: Diagnosis not present

## 2016-07-06 DIAGNOSIS — Z882 Allergy status to sulfonamides status: Secondary | ICD-10-CM

## 2016-07-06 DIAGNOSIS — Z85118 Personal history of other malignant neoplasm of bronchus and lung: Secondary | ICD-10-CM

## 2016-07-06 DIAGNOSIS — Z7982 Long term (current) use of aspirin: Secondary | ICD-10-CM | POA: Diagnosis not present

## 2016-07-06 DIAGNOSIS — E8809 Other disorders of plasma-protein metabolism, not elsewhere classified: Secondary | ICD-10-CM

## 2016-07-06 DIAGNOSIS — L909 Atrophic disorder of skin, unspecified: Secondary | ICD-10-CM

## 2016-07-06 DIAGNOSIS — M545 Low back pain: Secondary | ICD-10-CM | POA: Diagnosis present

## 2016-07-06 DIAGNOSIS — R5381 Other malaise: Secondary | ICD-10-CM

## 2016-07-06 DIAGNOSIS — E039 Hypothyroidism, unspecified: Secondary | ICD-10-CM | POA: Diagnosis present

## 2016-07-06 HISTORY — DX: Dyspnea, unspecified: R06.00

## 2016-07-06 HISTORY — DX: Depression, unspecified: F32.A

## 2016-07-06 HISTORY — DX: Other chronic pain: G89.29

## 2016-07-06 HISTORY — DX: Dorsalgia, unspecified: M54.9

## 2016-07-06 HISTORY — DX: Unspecified osteoarthritis, unspecified site: M19.90

## 2016-07-06 HISTORY — DX: Chronic obstructive pulmonary disease, unspecified: J44.9

## 2016-07-06 HISTORY — DX: Major depressive disorder, single episode, unspecified: F32.9

## 2016-07-06 LAB — CBC WITH DIFFERENTIAL/PLATELET
BASOS PCT: 0 %
Basophils Absolute: 0 10*3/uL (ref 0.0–0.1)
EOS PCT: 1 %
Eosinophils Absolute: 0.1 10*3/uL (ref 0.0–0.7)
HEMATOCRIT: 43.5 % (ref 36.0–46.0)
Hemoglobin: 14.9 g/dL (ref 12.0–15.0)
LYMPHS PCT: 21 %
Lymphs Abs: 1.2 10*3/uL (ref 0.7–4.0)
MCH: 32.7 pg (ref 26.0–34.0)
MCHC: 34.3 g/dL (ref 30.0–36.0)
MCV: 95.4 fL (ref 78.0–100.0)
MONO ABS: 0.5 10*3/uL (ref 0.1–1.0)
MONOS PCT: 8 %
NEUTROS ABS: 3.9 10*3/uL (ref 1.7–7.7)
Neutrophils Relative %: 70 %
PLATELETS: 217 10*3/uL (ref 150–400)
RBC: 4.56 MIL/uL (ref 3.87–5.11)
RDW: 16 % — AB (ref 11.5–15.5)
WBC: 5.6 10*3/uL (ref 4.0–10.5)

## 2016-07-06 LAB — COMPREHENSIVE METABOLIC PANEL
ALBUMIN: 3.7 g/dL (ref 3.5–5.0)
ALK PHOS: 56 U/L (ref 38–126)
ALT: 67 U/L — ABNORMAL HIGH (ref 14–54)
ANION GAP: 17 — AB (ref 5–15)
AST: 84 U/L — AB (ref 15–41)
BILIRUBIN TOTAL: 3.2 mg/dL — AB (ref 0.3–1.2)
BUN: 19 mg/dL (ref 6–20)
CALCIUM: 9.4 mg/dL (ref 8.9–10.3)
CO2: 23 mmol/L (ref 22–32)
Chloride: 92 mmol/L — ABNORMAL LOW (ref 101–111)
Creatinine, Ser: 1.15 mg/dL — ABNORMAL HIGH (ref 0.44–1.00)
GFR calc Af Amer: 52 mL/min — ABNORMAL LOW (ref >60)
GFR calc non Af Amer: 45 mL/min — ABNORMAL LOW (ref >60)
GLUCOSE: 88 mg/dL (ref 65–99)
Potassium: 3.7 mmol/L (ref 3.5–5.1)
SODIUM: 132 mmol/L — AB (ref 135–145)
TOTAL PROTEIN: 6.7 g/dL (ref 6.5–8.1)

## 2016-07-06 LAB — URINALYSIS, ROUTINE W REFLEX MICROSCOPIC
GLUCOSE, UA: NEGATIVE mg/dL
KETONES UR: 15 mg/dL — AB
Nitrite: NEGATIVE
PH: 6 (ref 5.0–8.0)
PROTEIN: 100 mg/dL — AB
Specific Gravity, Urine: 1.018 (ref 1.005–1.030)

## 2016-07-06 LAB — URINE MICROSCOPIC-ADD ON

## 2016-07-06 LAB — PHOSPHORUS: PHOSPHORUS: 3.9 mg/dL (ref 2.5–4.6)

## 2016-07-06 LAB — MAGNESIUM: Magnesium: 1.4 mg/dL — ABNORMAL LOW (ref 1.7–2.4)

## 2016-07-06 LAB — I-STAT CG4 LACTIC ACID, ED: Lactic Acid, Venous: 1.05 mmol/L (ref 0.5–1.9)

## 2016-07-06 LAB — BILIRUBIN, DIRECT: Bilirubin, Direct: 0.7 mg/dL — ABNORMAL HIGH (ref 0.1–0.5)

## 2016-07-06 LAB — T4, FREE: FREE T4: 1.57 ng/dL — AB (ref 0.61–1.12)

## 2016-07-06 LAB — TSH: TSH: 31.177 u[IU]/mL — ABNORMAL HIGH (ref 0.350–4.500)

## 2016-07-06 LAB — SEDIMENTATION RATE: Sed Rate: 4 mm/hr (ref 0–22)

## 2016-07-06 MED ORDER — ALBUTEROL SULFATE (2.5 MG/3ML) 0.083% IN NEBU
2.5000 mg | INHALATION_SOLUTION | RESPIRATORY_TRACT | Status: DC | PRN
  Administered 2016-07-08: 2.5 mg via RESPIRATORY_TRACT
  Filled 2016-07-06: qty 3

## 2016-07-06 MED ORDER — POTASSIUM CHLORIDE CRYS ER 20 MEQ PO TBCR
20.0000 meq | EXTENDED_RELEASE_TABLET | Freq: Every day | ORAL | Status: DC
  Administered 2016-07-06: 20 meq via ORAL
  Filled 2016-07-06: qty 1

## 2016-07-06 MED ORDER — ISOSORBIDE DINITRATE 10 MG PO TABS: 5.0000 mg | ORAL_TABLET | Freq: Three times a day (TID) | ORAL | Status: DC

## 2016-07-06 MED ORDER — BACITRACIN ZINC 500 UNIT/GM EX OINT
TOPICAL_OINTMENT | Freq: Two times a day (BID) | CUTANEOUS | Status: DC
  Administered 2016-07-06 – 2016-07-09 (×7): via TOPICAL
  Filled 2016-07-06 (×2): qty 28.35

## 2016-07-06 MED ORDER — TIZANIDINE HCL 2 MG PO TABS: 2.0000 mg | ORAL_TABLET | Freq: Three times a day (TID) | ORAL | Status: DC | PRN

## 2016-07-06 MED ORDER — SODIUM CHLORIDE 0.9 % IV SOLN
INTRAVENOUS | Status: AC
  Administered 2016-07-06: 13:00:00 via INTRAVENOUS

## 2016-07-06 MED ORDER — ONDANSETRON 4 MG PO TBDP
8.0000 mg | ORAL_TABLET | Freq: Once | ORAL | Status: AC
  Administered 2016-07-06: 8 mg via ORAL
  Filled 2016-07-06: qty 2

## 2016-07-06 MED ORDER — ENOXAPARIN SODIUM 40 MG/0.4ML ~~LOC~~ SOLN
40.0000 mg | SUBCUTANEOUS | Status: DC
  Administered 2016-07-06 – 2016-07-08 (×3): 40 mg via SUBCUTANEOUS
  Filled 2016-07-06 (×3): qty 0.4

## 2016-07-06 MED ORDER — ATORVASTATIN CALCIUM 40 MG PO TABS
40.0000 mg | ORAL_TABLET | Freq: Every day | ORAL | Status: DC
  Administered 2016-07-06 – 2016-07-08 (×3): 40 mg via ORAL
  Filled 2016-07-06 (×3): qty 1

## 2016-07-06 MED ORDER — SODIUM CHLORIDE 0.9 % IV BOLUS (SEPSIS)
1000.0000 mL | Freq: Once | INTRAVENOUS | Status: AC
  Administered 2016-07-06: 1000 mL via INTRAVENOUS

## 2016-07-06 MED ORDER — ACETAMINOPHEN 325 MG PO TABS: 650.0000 mg | ORAL_TABLET | Freq: Four times a day (QID) | ORAL | Status: DC | PRN

## 2016-07-06 MED ORDER — ACETAMINOPHEN 650 MG RE SUPP: 650.0000 mg | Freq: Four times a day (QID) | RECTAL | Status: DC | PRN

## 2016-07-06 MED ORDER — TRAZODONE HCL 50 MG PO TABS
50.0000 mg | ORAL_TABLET | Freq: Every evening | ORAL | Status: DC | PRN
Start: 2016-07-06 — End: 2016-07-09
  Administered 2016-07-06: 50 mg via ORAL
  Filled 2016-07-06: qty 1

## 2016-07-06 MED ORDER — ISOSORBIDE DINITRATE 10 MG PO TABS: 5.0000 mg | ORAL_TABLET | Freq: Every day | ORAL | Status: DC

## 2016-07-06 MED ORDER — HYDRALAZINE HCL 20 MG/ML IJ SOLN: 10.0000 mg | Freq: Three times a day (TID) | INTRAMUSCULAR | Status: DC | PRN

## 2016-07-06 MED ORDER — TRIAMTERENE-HCTZ 37.5-25 MG PO TABS
1.0000 | ORAL_TABLET | Freq: Every day | ORAL | Status: DC
  Filled 2016-07-06: qty 1

## 2016-07-06 MED ORDER — LEVOTHYROXINE SODIUM 50 MCG PO TABS
50.0000 ug | ORAL_TABLET | Freq: Every day | ORAL | Status: DC
  Administered 2016-07-06 – 2016-07-07 (×2): 50 ug via ORAL
  Filled 2016-07-06 (×2): qty 1

## 2016-07-06 NOTE — ED Triage Notes (Signed)
PT brought here by here sister for decreased mobility r/t leg weakness for several months.  Was evaluated by surgeon yesterday.

## 2016-07-06 NOTE — ED Notes (Signed)
Patient at u/s

## 2016-07-06 NOTE — Progress Notes (Signed)
New Admission Note: transfer drom ED  Arrival Method: stretcher Mental Orientation: a/o x4 Telemetry: none Assessment: Completed Skin:clean, skin tear left lower leg IV: RAC Pain: none Tubes: none Safety Measures: Safety Fall Prevention Plan has been given, discussed and signed Admission: Completed Unit Orientation: Patient has been orientated to the room, unit and staff.  Family: present   Orders have been reviewed and implemented. Will continue to monitor the patient. Call light has been placed within reach and bed alarm has been activated.   Retta Mac BSN, RN

## 2016-07-06 NOTE — ED Notes (Signed)
Patient comes in with c/o progressive bilateral leg weakness over the course of 3 years. Hx of chronic back pain, in which she receives epidurals to help with the pain. Patient has seen neurologist regarding leg weakness and MD did MRI and other scan, but did not show anything conclusive per family. Denies tingling or numbness in legs. + pedial pulses. LLL +2 edma. RLL +1 edema. Strength equal bilaterally. Active ROM. States she has difficulty walking.

## 2016-07-06 NOTE — ED Provider Notes (Signed)
[facility] DEPT Provider Note   CSN: [id number] Arrival date & time: 07/06/16  0906     History   Chief Complaint Chief Complaint  Patient presents with  . Leg weakness    HPI [name] is a 75 y.o. female presenting with chronic progressive leg weakness. Patient states that she has had progressive leg weakness over the past 2-3 years. She denies any pain. She came in today because her sister was insistent that she get further evaluated. She states that the muscle weakness affects her ability to ambulate around the house. She spends most of her time in bed or in her lazyboy. She states that she has difficulty moving from a seated to a standing position. She is unable to stay upright for prolonged period of time. She denies any shortness of breath or chest pain. No headaches or blurred vision. No recent fevers or chills. No nausea, vomiting, or diarrhea.  Patient has a past history significant for lung cancer. She was recently seen by her neurosurgeon who performed a MRI and myelogram without significant findings.  HPI  Past Medical History:  Diagnosis Date  . Chronic back pain   . COPD (chronic obstructive pulmonary disease) ([facility])   . lt lung ca dx'd 2007   lu-lobectomy  . rt lung ca dx'd 2001   ru-lobectomy    Patient Active Problem List   Diagnosis Date Noted  . Malignant neoplasm of bronchus and lung, unspecified site 01/19/2013    Past Surgical History:  Procedure Laterality Date  . caral tunnel surgery    . LUNG CANCER SURGERY      OB History    No data available       Home Medications    Prior to Admission medications   Medication Sig Start Date End Date Taking? Authorizing Provider  aspirin 81 MG tablet Take 81 mg by mouth daily.   Yes Historical Provider, MD  atorvastatin (LIPITOR) 40 MG tablet Take 40 mg by mouth daily.    Yes Historical Provider, MD  Biotin 5000 MCG CAPS Take 5,000 mcg by mouth daily.   Yes Historical Provider, MD    Cholecalciferol (VITAMIN D) 2000 units tablet Take 2,000 Units by mouth daily.    Yes Historical Provider, MD  isosorbide dinitrate (ISORDIL) 5 MG tablet Take 5 mg by mouth daily.   Yes Historical Provider, MD  potassium chloride SA (K-DUR,KLOR-CON) 20 MEQ tablet Take 20 mEq by mouth daily.   Yes Historical Provider, MD  triamterene-hydrochlorothiazide (MAXZIDE-25) 37.5-25 MG tablet Take 1 tablet by mouth daily.   Yes Historical Provider, MD  tiZANidine (ZANAFLEX) 4 MG tablet Take 4 mg by mouth every 8 (eight) hours as needed for muscle spasms.    Historical Provider, MD    Family History Family History  Problem Relation Age of Onset  . Family history unknown: Yes    Social History Social History  Substance Use Topics  . Smoking status: Former Smoker    Quit date: 09/30/1999  . Smokeless tobacco: Never Used  . Alcohol use 8.4 oz/week    14 Shots of liquor per week     Comment: 2 drinks a day     Allergies   Sulfa antibiotics   Review of Systems Review of Systems  Constitutional: Negative for activity change, appetite change, chills, diaphoresis, fatigue and fever.  HENT: Negative.   Eyes: Negative.   Respiratory: Negative.   Cardiovascular: Negative.   Gastrointestinal: Negative.   Endocrine: Negative.   Genitourinary: Negative.  Musculoskeletal: Negative.   Neurological: Negative.   Hematological: Negative.   Psychiatric/Behavioral: Negative.      Physical Exam Updated Vital Signs BP 93/70   Pulse 68   Temp 98.2 F (36.8 C) (Oral)   Resp 17   Ht '5\' 2"'$  (1.575 m)   Wt 48.1 kg   SpO2 98%   BMI 19.39 kg/m   Physical Exam  Constitutional: She is oriented to person, place, and time. She appears well-developed and well-nourished. No distress.  HENT:  Head: Normocephalic and atraumatic.  Mouth/Throat: Oropharynx is clear and moist.  Eyes: Conjunctivae and EOM are normal. Pupils are equal, round, and reactive to light. Scleral icterus is present.  Neck:  Normal range of motion. Neck supple. No JVD present. No thyromegaly present.  Cardiovascular: Normal rate, regular rhythm and normal heart sounds.   No murmur heard. Pulmonary/Chest: Effort normal. No respiratory distress. She has no wheezes. She has no rales. She exhibits no tenderness.  Abdominal: Soft. Bowel sounds are normal. She exhibits no distension. There is no tenderness. There is no guarding.  Musculoskeletal: Normal range of motion. She exhibits edema. She exhibits no tenderness or deformity.       Legs: 4/5 bilateral leg strength. +1 bilateral patellar DTRs  Full ROM +2 edema bilateral  Neurological: She is alert and oriented to person, place, and time. No cranial nerve deficit.  Skin: Skin is warm and dry. Capillary refill takes less than 2 seconds. She is not diaphoretic.  Psychiatric: She has a normal mood and affect. Her behavior is normal. Thought content normal.     ED Treatments / Results  Labs (all labs ordered are listed, but only abnormal results are displayed) Labs Reviewed  COMPREHENSIVE METABOLIC PANEL - Abnormal; Notable for the following:       Result Value   Sodium 132 (*)    Chloride 92 (*)    Creatinine, Ser 1.15 (*)    AST 84 (*)    ALT 67 (*)    Total Bilirubin 3.2 (*)    GFR calc non Af Amer 45 (*)    GFR calc Af Amer 52 (*)    Anion gap 17 (*)    All other components within normal limits  CBC WITH DIFFERENTIAL/PLATELET - Abnormal; Notable for the following:    RDW 16.0 (*)    All other components within normal limits  TSH - Abnormal; Notable for the following:    TSH 31.177 (*)    All other components within normal limits  SEDIMENTATION RATE  URINALYSIS, ROUTINE W REFLEX MICROSCOPIC (NOT AT [facility])  BILIRUBIN, DIRECT  MAGNESIUM  PHOSPHORUS  T4, FREE  I-STAT CG4 LACTIC ACID, ED    EKG  EKG Interpretation  Date/Time:  Friday July 06 2016 09:12:48 EDT Ventricular Rate:  78 PR Interval:  166 QRS Duration: 82 QT Interval:  276 QTC  Calculation: 314 R Axis:   -74 Text Interpretation:  Normal sinus rhythm Possible Left atrial enlargement Left axis deviation Cannot rule out Anteroseptal infarct , age undetermined Abnormal ECG No significant change since last tracing Confirmed by FLOYD MD, Quillian Quince ([id number]) on 07/06/2016 9:57:22 AM       Radiology No results found.  Procedures Procedures (including critical care time)  Medications Ordered in ED Medications  0.9 %  sodium chloride infusion (not administered)  ondansetron (ZOFRAN-ODT) disintegrating tablet 8 mg (8 mg Oral Given 07/06/16 1102)  sodium chloride 0.9 % bolus 1,000 mL (1,000 mLs Intravenous New Bag/Given 07/06/16 1103)  Initial Impression / Assessment and Plan / ED Course  I have reviewed the triage vital signs and the nursing notes.  Pertinent labs & imaging results that were available during my care of the patient were reviewed by me and considered in my medical decision making (see chart for details).  Clinical Course   Lower extremity weakness, chronic, progressive: Patient is here with chronic progressive lower extremity weakness. Symptoms are relatively nonspecific and difficult to fully assess this time. She is been evaluated by her neurosurgeon as an outpatient who according to the patient, does not believe there is contributing spinal compression. With patient's history of lung cancer would strongly consider possible Lambert-Eaton myasthenic syndrome as the cause of progressive weakness.  Transaminitis and anion gap acidosis: Initial workup for patient's bilateral leg weakness yielded a concerning findings which may be independent of her leg weakness. Patient was noted have a anion gap of 17, hyponatremia 132, elevated transaminases (AST 84/ALT 67), elevated bilirubin 3.2, and an elevated TSH of 31.177. Scleral icterus was noted on physical exam. Patient denied any abdominal pain at this time. The decision was made to have patient admitted for  further workup into these abnormalities.   Final Clinical Impressions(s) / ED Diagnoses   Final diagnoses:  Hyperbilirubinemia    New Prescriptions New Prescriptions   No medications on file     Elberta Leatherwood, MD 07/06/16 [place], DO 07/06/16 1317

## 2016-07-06 NOTE — Plan of Care (Signed)
Problem: Safety: Goal: Ability to remain free from injury will improve Outcome: Progressing Educated pt on to call before getting up pt verbalized understanding

## 2016-07-06 NOTE — H&P (Signed)
[facility] History and Physical  [name] AGT:[id number] DOB: 1941-01-21 DOA: 07/06/2016  Referring physician: PCP: Merrilee Seashore, MD   Chief Complaint: "l just don't have the strength to walk."  HPI: [name] is a 75 y.o. female w/ pmhx of chronic back pain, hx of lung CA, copd who presented with leg weakness. Patient has had leg weakness for 3 years. She is currently being worked up by Dr. Arnoldo Morale with neurosurgery. Patient's sister came to visit her and was concerned about patient's weakness. Patient has difficulty going from sitting to standing and standing to sitting. Patient's sister brought her to the emergency room for more complete evaluation.  Denies fevers, chills, vomiting diarrhea, rash, falls and headache. Pos nausea w/ retching over last few days, x3.  Review of Systems:  As per HPI otherwise 10 point review of systems negative.    Past Medical History:  Diagnosis Date  . Chronic back pain   . COPD (chronic obstructive pulmonary disease) ([facility])   . lt lung ca dx'd 2007   lu-lobectomy  . rt lung ca dx'd 2001   ru-lobectomy   Past Surgical History:  Procedure Laterality Date  . CARPAL TUNNEL RELEASE    . LUNG CANCER SURGERY    . TONSILLECTOMY     Social History:  reports that she quit smoking about 16 years ago. She has never used smokeless tobacco. She reports that she drinks about 8.4 oz of alcohol per week . She reports that she does not use drugs.  Allergies  Allergen Reactions  . Sulfa Antibiotics Hives    Family History  Problem Relation Age of Onset  . Family history unknown: Yes     Prior to Admission medications   Medication Sig Start Date End Date Taking? Authorizing Provider  aspirin 81 MG tablet Take 81 mg by mouth daily.   Yes Historical Provider, MD  atorvastatin (LIPITOR) 40 MG tablet Take 40 mg by mouth daily.    Yes Historical Provider, MD  Biotin 5000 MCG CAPS Take 5,000 mcg by mouth daily.   Yes Historical  Provider, MD  Cholecalciferol (VITAMIN D) 2000 units tablet Take 2,000 Units by mouth daily.    Yes Historical Provider, MD  isosorbide dinitrate (ISORDIL) 5 MG tablet Take 5 mg by mouth daily.   Yes Historical Provider, MD  potassium chloride SA (K-DUR,KLOR-CON) 20 MEQ tablet Take 20 mEq by mouth daily.   Yes Historical Provider, MD  tiZANidine (ZANAFLEX) 4 MG tablet Take 2-4 mg by mouth every 8 (eight) hours as needed for muscle spasms.    Yes Historical Provider, MD  triamterene-hydrochlorothiazide (MAXZIDE-25) 37.5-25 MG tablet Take 1 tablet by mouth daily.   Yes Historical Provider, MD   Physical Exam: Vitals:   07/06/16 1045 07/06/16 1100 07/06/16 1115 07/06/16 1136  BP: '93/65 92/65 98/60 '$ 93/70  Pulse:  68    Resp:      Temp:      TempSrc:      SpO2:  98%    Weight:      Height:        Wt Readings from Last 3 Encounters:  07/06/16 48.1 kg (106 lb)  01/26/14 56.7 kg (125 lb)  01/19/13 54.4 kg (120 lb)    General:  Appears calm and comfortable Eyes:  PERRL, EOMI, normal lids, iris ENT:  grossly normal hearing, lips & tongue, Dry oral mucosa Neck:  no LAD, masses or thyromegaly Cardiovascular:  RRR, no m/r/g. No LE edema.  Respiratory:  Diffuse  mild wheezing, normal work of breathing, speaking in full sentences Abdomen:  soft, ntnd Skin:  no rash or induration seen on limited exam Musculoskeletal:  grossly normal tone BUE/BLE Psychiatric:  grossly normal mood and affect, speech fluent and appropriate Neurologic:  CN 2-12 grossly intact, moves all extremities in coordinated fashion.Liver and oriented 3           Labs on Admission:  Basic Metabolic Panel:  Recent Labs Lab 07/06/16 1056  NA 132*  K 3.7  CL 92*  CO2 23  GLUCOSE 88  BUN 19  CREATININE 1.15*  CALCIUM 9.4   Liver Function Tests:  Recent Labs Lab 07/06/16 1056  AST 84*  ALT 67*  ALKPHOS 56  BILITOT 3.2*  PROT 6.7  ALBUMIN 3.7   No results for input(s): LIPASE, AMYLASE in the last 168  hours. No results for input(s): AMMONIA in the last 168 hours. CBC:  Recent Labs Lab 07/06/16 1056  WBC 5.6  NEUTROABS 3.9  HGB 14.9  HCT 43.5  MCV 95.4  PLT 217   Cardiac Enzymes: No results for input(s): CKTOTAL, CKMB, CKMBINDEX, TROPONINI in the last 168 hours.  BNP (last 3 results) No results for input(s): BNP in the last 8760 hours.  ProBNP (last 3 results) No results for input(s): PROBNP in the last 8760 hours.   Serum creatinine: 1.15 mg/dL High 07/06/16 1056 Estimated creatinine clearance: 31.6 mL/min  CBG: No results for input(s): GLUCAP in the last 168 hours.  Radiological Exams on Admission: No results found.  EKG: Independently reviewed. Ventricular rate 78, PR interval 166, QRS 82, QTC 314; flipped T waves in anterior lateral leads no STEMI-no previous EKG for comparison  Assessment/Plan Principal Problem:   Hyperbilirubinemia Active Problems:   Weakness of both lower extremities   Hypothyroidism   Hyponatremia   COPD (chronic obstructive pulmonary disease) ([facility])   Chronic back pain   Hyperbilirubinemia Checking direct bilirubin Right upper quadrant ultrasound ordered Prn zofran for nausea  HTN Continue Isordil and Maxzide & kdur  Hyponatremia 132 on admit, nl at baseline Mild dehydration on exam on admission Hypovolemic hyponatremia 1 L saline emergency room Gentle hydration No neurological deficits with acuity Serial sodium  Hypothyroid Starting 15mg of synthroid Checking FT4   History of lung cancer Patient is unsure of the type of cancer she had had definitive treatment with bilateral lobectomies Patient's possibility of paraneoplastic syndrome is unlikely to explainher leg weakness b/ct her only weaknesses in her legs so myasthenia gravis  Presentation due to maliganncy is unlikely  Mild Aki vs CKD No past labs for comparison Will re-eval in AM after hydration overnight I&O  Lower extremity weakness Followed by Dr.  JArnoldo Moralewith neurosurgery Reviewed imaging reports, next thing acute PT OT consult on patient Patient will follow up with neurosurgery on discharge  COPD Patient has chronic baseline wheeze Prn albuterol  Skin tear Bacitracin Non adherent dressing  Hyperlipidemia Continue statin  Chronic lower back pain Continue Zanaflex  Code Status: FULL (no more than 135mon life support), sister and son surrogate decision makers DVT Prophylaxis: heparin Family Communication: sister at bedside Disposition Plan: Home    PhElwin MochaMD Family Medicine [facility] [email or web address] Password [email or web address]

## 2016-07-07 DIAGNOSIS — E785 Hyperlipidemia, unspecified: Secondary | ICD-10-CM | POA: Diagnosis present

## 2016-07-07 DIAGNOSIS — J449 Chronic obstructive pulmonary disease, unspecified: Secondary | ICD-10-CM | POA: Diagnosis not present

## 2016-07-07 DIAGNOSIS — G8929 Other chronic pain: Secondary | ICD-10-CM | POA: Diagnosis not present

## 2016-07-07 DIAGNOSIS — I959 Hypotension, unspecified: Secondary | ICD-10-CM | POA: Diagnosis not present

## 2016-07-07 DIAGNOSIS — R29898 Other symptoms and signs involving the musculoskeletal system: Secondary | ICD-10-CM | POA: Diagnosis not present

## 2016-07-07 DIAGNOSIS — R110 Nausea: Secondary | ICD-10-CM | POA: Diagnosis not present

## 2016-07-07 DIAGNOSIS — Z681 Body mass index (BMI) 19 or less, adult: Secondary | ICD-10-CM | POA: Diagnosis not present

## 2016-07-07 DIAGNOSIS — E039 Hypothyroidism, unspecified: Secondary | ICD-10-CM | POA: Diagnosis present

## 2016-07-07 DIAGNOSIS — E440 Moderate protein-calorie malnutrition: Secondary | ICD-10-CM | POA: Diagnosis not present

## 2016-07-07 DIAGNOSIS — Z902 Acquired absence of lung [part of]: Secondary | ICD-10-CM | POA: Diagnosis not present

## 2016-07-07 DIAGNOSIS — D631 Anemia in chronic kidney disease: Secondary | ICD-10-CM | POA: Diagnosis not present

## 2016-07-07 DIAGNOSIS — Z9889 Other specified postprocedural states: Secondary | ICD-10-CM | POA: Diagnosis not present

## 2016-07-07 DIAGNOSIS — Z7982 Long term (current) use of aspirin: Secondary | ICD-10-CM | POA: Diagnosis not present

## 2016-07-07 DIAGNOSIS — R638 Other symptoms and signs concerning food and fluid intake: Secondary | ICD-10-CM | POA: Diagnosis present

## 2016-07-07 DIAGNOSIS — Z85118 Personal history of other malignant neoplasm of bronchus and lung: Secondary | ICD-10-CM | POA: Diagnosis not present

## 2016-07-07 DIAGNOSIS — M6281 Muscle weakness (generalized): Secondary | ICD-10-CM | POA: Diagnosis not present

## 2016-07-07 DIAGNOSIS — N183 Chronic kidney disease, stage 3 unspecified: Secondary | ICD-10-CM | POA: Diagnosis present

## 2016-07-07 DIAGNOSIS — E46 Unspecified protein-calorie malnutrition: Secondary | ICD-10-CM | POA: Diagnosis present

## 2016-07-07 DIAGNOSIS — D638 Anemia in other chronic diseases classified elsewhere: Secondary | ICD-10-CM | POA: Diagnosis present

## 2016-07-07 DIAGNOSIS — Z87891 Personal history of nicotine dependence: Secondary | ICD-10-CM | POA: Diagnosis not present

## 2016-07-07 DIAGNOSIS — E860 Dehydration: Secondary | ICD-10-CM | POA: Diagnosis not present

## 2016-07-07 DIAGNOSIS — E871 Hypo-osmolality and hyponatremia: Secondary | ICD-10-CM | POA: Diagnosis present

## 2016-07-07 DIAGNOSIS — I129 Hypertensive chronic kidney disease with stage 1 through stage 4 chronic kidney disease, or unspecified chronic kidney disease: Secondary | ICD-10-CM | POA: Diagnosis present

## 2016-07-07 DIAGNOSIS — R238 Other skin changes: Secondary | ICD-10-CM | POA: Diagnosis present

## 2016-07-07 DIAGNOSIS — N390 Urinary tract infection, site not specified: Secondary | ICD-10-CM | POA: Diagnosis present

## 2016-07-07 DIAGNOSIS — M545 Low back pain: Secondary | ICD-10-CM | POA: Diagnosis not present

## 2016-07-07 DIAGNOSIS — Z882 Allergy status to sulfonamides status: Secondary | ICD-10-CM | POA: Diagnosis not present

## 2016-07-07 DIAGNOSIS — Z79899 Other long term (current) drug therapy: Secondary | ICD-10-CM | POA: Diagnosis not present

## 2016-07-07 DIAGNOSIS — M4802 Spinal stenosis, cervical region: Secondary | ICD-10-CM | POA: Diagnosis not present

## 2016-07-07 DIAGNOSIS — L909 Atrophic disorder of skin, unspecified: Secondary | ICD-10-CM

## 2016-07-07 LAB — CBC
HCT: 35.6 % — ABNORMAL LOW (ref 36.0–46.0)
HEMOGLOBIN: 11.8 g/dL — AB (ref 12.0–15.0)
MCH: 31.8 pg (ref 26.0–34.0)
MCHC: 33.1 g/dL (ref 30.0–36.0)
MCV: 96 fL (ref 78.0–100.0)
PLATELETS: 166 10*3/uL (ref 150–400)
RBC: 3.71 MIL/uL — AB (ref 3.87–5.11)
RDW: 15.9 % — ABNORMAL HIGH (ref 11.5–15.5)
WBC: 4.3 10*3/uL (ref 4.0–10.5)

## 2016-07-07 LAB — HEPATIC FUNCTION PANEL
ALBUMIN: 2.6 g/dL — AB (ref 3.5–5.0)
ALK PHOS: 42 U/L (ref 38–126)
ALT: 52 U/L (ref 14–54)
AST: 68 U/L — AB (ref 15–41)
BILIRUBIN INDIRECT: 1.9 mg/dL — AB (ref 0.3–0.9)
Bilirubin, Direct: 0.5 mg/dL (ref 0.1–0.5)
TOTAL PROTEIN: 4.8 g/dL — AB (ref 6.5–8.1)
Total Bilirubin: 2.4 mg/dL — ABNORMAL HIGH (ref 0.3–1.2)

## 2016-07-07 LAB — BASIC METABOLIC PANEL
ANION GAP: 10 (ref 5–15)
BUN: 16 mg/dL (ref 6–20)
CALCIUM: 8.1 mg/dL — AB (ref 8.9–10.3)
CO2: 24 mmol/L (ref 22–32)
CREATININE: 1.06 mg/dL — AB (ref 0.44–1.00)
Chloride: 100 mmol/L — ABNORMAL LOW (ref 101–111)
GFR, EST AFRICAN AMERICAN: 58 mL/min — AB (ref >60)
GFR, EST NON AFRICAN AMERICAN: 50 mL/min — AB (ref >60)
Glucose, Bld: 79 mg/dL (ref 65–99)
Potassium: 3.6 mmol/L (ref 3.5–5.1)
SODIUM: 134 mmol/L — AB (ref 135–145)

## 2016-07-07 MED ORDER — SODIUM CHLORIDE 0.9 % IV BOLUS (SEPSIS)
250.0000 mL | Freq: Once | INTRAVENOUS | Status: AC
  Administered 2016-07-07: 250 mL via INTRAVENOUS

## 2016-07-07 MED ORDER — ONDANSETRON 4 MG PO TBDP: 4.0000 mg | ORAL_TABLET | Freq: Three times a day (TID) | ORAL | Status: DC | PRN

## 2016-07-07 MED ORDER — SODIUM CHLORIDE 0.9 % IV BOLUS (SEPSIS)
500.0000 mL | Freq: Once | INTRAVENOUS | Status: AC
  Administered 2016-07-07: 500 mL via INTRAVENOUS

## 2016-07-07 MED ORDER — SODIUM CHLORIDE 0.9 % IV SOLN
INTRAVENOUS | Status: AC
  Administered 2016-07-07 – 2016-07-08 (×4): via INTRAVENOUS

## 2016-07-07 MED ORDER — COSYNTROPIN 0.25 MG IJ SOLR
0.2500 mg | Freq: Once | INTRAMUSCULAR | Status: AC
  Administered 2016-07-08: 0.25 mg via INTRAVENOUS
  Filled 2016-07-07: qty 0.25

## 2016-07-07 NOTE — Progress Notes (Addendum)
PROGRESS NOTE    [name]  NAT:[id number] DOB: 21-Nov-1940 DOA: 07/06/2016 PCP: Merrilee Seashore, MD     Brief Narrative:  [name] is a 75 y.o. female w/ pmhx of chronic back pain, hx of lung CA, COPD who presented to ED with chief complaint of leg weakness. Patient has had leg weakness for 3 years. She is currently being worked up by Dr. Arnoldo Morale with neurosurgery. Patient's sister came to visit her and was concerned about patient's weakness. Patient has difficulty going from sitting to standing and standing to sitting. Patient's sister brought her to the emergency room for more complete evaluation. Patient had also complained of nausea with dry heaving over the last few weeks associated with poor oral intake. Patient does live at home alone.   Assessment & Plan:   Principal Problem:   Dehydration Active Problems:   Weakness of both lower extremities   Hyponatremia   Chronic back pain   Hyperbilirubinemia   Decreased oral intake   Hypotension   CKD (chronic kidney disease) stage 3, GFR 30-59 ml/min   HLD (hyperlipidemia)   Skin breakdown   Dehydration from poor oral intake -Improving with IVF. Advance diet to general.   Hyperbilirubinemia -Right upper quadrant ultrasound was negative. No abdominal pain. Suspect this was due to dehydration. Improving with IV fluids. Trend LFT   Hypotension -Could be secondary to dehydration. Hold isordil, maxide to hypotension. Will check ACTH stim test in setting of hypotension.   Mild hypovolemic hyponatremia -Improving with IV fluids  Asymptomatic pyuria -No dysuria. Await urine culture, no antibiotics for now   Elevated TSH and elevated T4 -Not consistent with hypothyroidism. No previous hx of thyroid disease. Stop synthroid. Should follow up with repeat labs in 4-6 weeks.   History of lung cancer -Previously treated with bilateral lobectomies > 10 years ago   CKD stage 3 -Baseline creatinine around 1. Stable.    Chronic lower extremity weakness -Follows with Dr. Arnoldo Morale (see neurosurgery). Patient to follow-up with neurosurgery as outpatient. Consult PT, OT  HLD -Continue statin  LLE skin tear -States skin tore after compression stocking was taken off. Local wound care    DVT prophylaxis: lovenox  Code Status: Full Family Communication: no family at bedside Disposition Plan: pending further work up and treatment. PT/OT to evaluate.    Consultants:   None  Procedures:   None  Antimicrobials:   None    Subjective: Patient states that she is feeling a little bit better today. She complains of chronic lower extremity weakness going on for 3 years, has been following up with neurosurgery outpatient. She complains of decreased oral intake for the past 3 weeks associated with nausea and dry heaving. She denies any abdominal pain. No bowel movement changes or dysuria. Afebrile at home. She denies any dizziness, lightheadedness. States that her baseline pressure is 120/70.  Objective: Vitals:   07/07/16 0414 07/07/16 0443 07/07/16 0701 07/07/16 0808  BP: (!) 86/48 (!) 80/50 (!) 77/48 (!) 82/60  Pulse:  72    Resp:  17    Temp:  98.5 F (36.9 C)    TempSrc:  Oral    SpO2:  93%    Weight:      Height:        Intake/Output Summary (Last 24 hours) at 07/07/16 0929 Last data filed at 07/07/16 0928  Gross per 24 hour  Intake             1420 ml  Output  275 ml  Net             1145 ml   Filed Weights   07/06/16 0919 07/06/16 2145  Weight: 48.1 kg (106 lb) 49.1 kg (108 lb 3.9 oz)    Examination:  General exam: Appears calm and comfortable  Respiratory system: Clear to auscultation. Respiratory effort normal. Cardiovascular system: S1 & S2 heard, RRR. No JVD, murmurs, rubs, gallops or clicks. No pedal edema. Gastrointestinal system: Abdomen is nondistended, soft and nontender. No organomegaly or masses felt. Normal bowel sounds heard. Central nervous system:  Alert and oriented. No focal neurological deficits. Extremities: Symmetric 5 x 5 power. No focal weakness of Le.  Skin: +left shin with skin breakdown, blistering.  Psychiatry: Judgement and insight appear normal. Mood & affect appropriate.   Data Reviewed: I have personally reviewed following labs and imaging studies  CBC:  Recent Labs Lab 07/06/16 1056 07/07/16 0443  WBC 5.6 4.3  NEUTROABS 3.9  --   HGB 14.9 11.8*  HCT 43.5 35.6*  MCV 95.4 96.0  PLT 217 076   Basic Metabolic Panel:  Recent Labs Lab 07/06/16 1056 07/06/16 1413 07/07/16 0443  NA 132*  --  134*  K 3.7  --  3.6  CL 92*  --  100*  CO2 23  --  24  GLUCOSE 88  --  79  BUN 19  --  16  CREATININE 1.15*  --  1.06*  CALCIUM 9.4  --  8.1*  MG  --  1.4*  --   PHOS  --  3.9  --    GFR: Estimated Creatinine Clearance: 35 mL/min (by C-G formula based on SCr of 1.06 mg/dL (H)). Liver Function Tests:  Recent Labs Lab 07/06/16 1056 07/07/16 0800  AST 84* 68*  ALT 67* 52  ALKPHOS 56 42  BILITOT 3.2* 2.4*  PROT 6.7 4.8*  ALBUMIN 3.7 2.6*   No results for input(s): LIPASE, AMYLASE in the last 168 hours. No results for input(s): AMMONIA in the last 168 hours. Coagulation Profile: No results for input(s): INR, PROTIME in the last 168 hours. Cardiac Enzymes: No results for input(s): CKTOTAL, CKMB, CKMBINDEX, TROPONINI in the last 168 hours. BNP (last 3 results) No results for input(s): PROBNP in the last 8760 hours. HbA1C: No results for input(s): HGBA1C in the last 72 hours. CBG: No results for input(s): GLUCAP in the last 168 hours. Lipid Profile: No results for input(s): CHOL, HDL, LDLCALC, TRIG, CHOLHDL, LDLDIRECT in the last 72 hours. Thyroid Function Tests:  Recent Labs  07/06/16 1032 07/06/16 1413  TSH 31.177*  --   FREET4  --  1.57*   Anemia Panel: No results for input(s): VITAMINB12, FOLATE, FERRITIN, TIBC, IRON, RETICCTPCT in the last 72 hours. Sepsis Labs:  Recent Labs Lab  07/06/16 1252  LATICACIDVEN 1.05    No results found for this or any previous visit (from the past 240 hour(s)).     Radiology Studies: US Abdomen Limited Ruq  Result Date: 07/06/2016 CLINICAL DATA:  Patient with hyperbilirubinemia.  Leg weakness. EXAM: US ABDOMEN LIMITED - RIGHT UPPER QUADRANT COMPARISON:  None. FINDINGS: Gallbladder: No gallstones or wall thickening visualized. No sonographic Murphy sign noted by sonographer. Common bile duct: Diameter: 7 mm Liver: No focal lesion identified. Increased hepatic parenchymal echogenicity IMPRESSION: Unremarkable right upper quadrant ultrasound. No cholelithiasis or sonographic evidence of acute cholecystitis. Mild hepatic steatosis. Electronically Signed   By: Lovey Newcomer M.D.   On: 07/06/2016 14:50  Scheduled Meds: . atorvastatin  40 mg Oral q1800  . bacitracin   Topical BID  . [START ON 07/08/2016] cosyntropin  0.25 mg Intravenous Once  . enoxaparin (LOVENOX) injection  40 mg Subcutaneous Q24H   Continuous Infusions: . sodium chloride 125 mL/hr at 07/07/16 0817     LOS: 0 days    Time spent: 40 minutes   Dessa Phi, DO [facility] [email or web address] Password [email or web address] 07/07/2016, 9:29 AM

## 2016-07-07 NOTE — Progress Notes (Signed)
Pt BP at 0700 77/48 hr 70 DR CHOI notified pt has no c/o dizzyness, lightheaded,or weakness pt stated"I feel fine" awaiting orders, report passed on to first shift RN

## 2016-07-07 NOTE — Evaluation (Signed)
Physical Therapy Evaluation Patient Details Name: [name] MRN: [id number] DOB: 13-Jun-1941 Today's Date: 07/07/2016   History of Present Illness  Patient is a 75 yo female admitted 07/06/16 with BLE weakness, dehydration, hypotension, gait disturbance.   PMH:  chronic back pain, back surgeries, lung CA, COPD, CKD, HLD, LE weakness    Clinical Impression  Patient presents with problems listed below.  Will benefit from acute PT to maximize functional mobility prior to discharge.  Patient was independent pta, and required min-mod assist for mobility/gait today.  Recommend Inpatient Rehab consult for comprehensive therapies to return patient to highest functional level with goal to return home.    Follow Up Recommendations CIR;Supervision for mobility/OOB    Equipment Recommendations  Wheelchair (measurements PT);Wheelchair cushion (measurements PT)    Recommendations for Other Services Rehab consult     Precautions / Restrictions Precautions Precautions: Fall Restrictions Weight Bearing Restrictions: No      Mobility  Bed Mobility Overal bed mobility: Needs Assistance Bed Mobility: Supine to Sit;Sit to Supine     Supine to sit: Supervision Sit to supine: Supervision   General bed mobility comments: Assist for safety only.  Use of rail.  Transfers Overall transfer level: Needs assistance Equipment used: Rolling walker (2 wheeled) Transfers: Sit to/from Stand Sit to Stand: Min assist;From elevated surface;Mod assist         General transfer comment: Cues to scoot to EOB.  Min assist to rise to standing and for balance from higher bed.  In stance, had patient perform shallow squat x2 with assist for balance.  Mod assist to rise from lower chair.  Ambulation/Gait Ambulation/Gait assistance: Mod assist Ambulation Distance (Feet): 8 Feet (8' x2 with sitting rest break between) Assistive device: Rolling walker (2 wheeled) Gait Pattern/deviations: Step-through  pattern;Decreased step length - right;Decreased step length - left;Decreased stride length;Shuffle;Trunk flexed Gait velocity: decreased Gait velocity interpretation: Below normal speed for age/gender General Gait Details: Verbal cues for safe use of RW.  Assist for balance and safety.  At 8', patient reports knees are "giving out" and buckling.  Assist to chair.  After rest, mod assist to move sit to stand from chair.  Ambulated 8' with RW and returned to bed.  LE's fatigued quickly.  Stairs            Wheelchair Mobility    Modified Rankin (Stroke Patients Only)       Balance Overall balance assessment: Needs assistance         Standing balance support: Bilateral upper extremity supported Standing balance-Leahy Scale: Poor Standing balance comment: Requires UE support for balance                             Pertinent Vitals/Pain Pain Assessment: No/denies pain    Home Living Family/patient expects to be discharged to:: Private residence Living Arrangements: Alone Available Help at Discharge: Friend(s);Family;Available PRN/intermittently (Sister and son live 3 and 2 hours away respectively) Type of Home: Mobile home Home Access: Stairs to enter Entrance Stairs-Rails: Psychiatric nurse of Steps: 4 Home Layout: One level Home Equipment: Walker - 2 wheels;Walker - 4 wheels;Bedside commode;Shower seat      Prior Function Level of Independence: Independent;Independent with assistive device(s)         Comments: Independent prior to decline in LE strength. Using rollator for gait.  Driving until 3 weeks pta.     Hand Dominance        Extremity/Trunk  Assessment   Upper Extremity Assessment: Generalized weakness           Lower Extremity Assessment: Generalized weakness (Strength grossly 4-/5; Muscles fatigue quickly)         Communication   Communication: No difficulties;HOH  Cognition Arousal/Alertness:  Awake/alert Behavior During Therapy: WFL for tasks assessed/performed Overall Cognitive Status: Within Functional Limits for tasks assessed                      General Comments      Exercises     Assessment/Plan    PT Assessment Patient needs continued PT services  PT Problem List Decreased strength;Decreased activity tolerance;Decreased balance;Decreased mobility;Decreased knowledge of use of DME          PT Treatment Interventions DME instruction;Gait training;Functional mobility training;Therapeutic activities;Therapeutic exercise;Balance training;Cognitive remediation    PT Goals (Current goals can be found in the Care Plan section)  Acute Rehab PT Goals Patient Stated Goal: To be able to return home safely PT Goal Formulation: With patient/family Time For Goal Achievement: 07/21/16 Potential to Achieve Goals: Good    Frequency Min 3X/week   Barriers to discharge Decreased caregiver support Patient lives alone    Co-evaluation               End of Session Equipment Utilized During Treatment: Gait belt Activity Tolerance: Patient limited by fatigue Patient left: in bed;with call bell/phone within reach;with family/visitor present Nurse Communication: Mobility status         Time: 1517-6160 PT Time Calculation (min) (ACUTE ONLY): 33 min   Charges:   PT Evaluation $PT Eval Moderate Complexity: 1 Procedure PT Treatments $Gait Training: 8-22 mins   PT G CodesDespina Pole 03-Aug-2016, 7:14 PM Carita Pian. Sanjuana Kava, [place] Pager [phone]

## 2016-07-07 NOTE — Progress Notes (Signed)
Pt still running low blood pressures in 80"s pt vital signs 80/50 heartrate 72, Dr Allyson Sabal notified see phycians orders

## 2016-07-07 NOTE — Progress Notes (Signed)
Pt running low Blood pressures in the 80"s DR Aggie Moats notified see physicians orders.

## 2016-07-08 DIAGNOSIS — E039 Hypothyroidism, unspecified: Secondary | ICD-10-CM

## 2016-07-08 LAB — HEPATIC FUNCTION PANEL
ALBUMIN: 2.6 g/dL — AB (ref 3.5–5.0)
ALK PHOS: 41 U/L (ref 38–126)
ALT: 42 U/L (ref 14–54)
AST: 49 U/L — AB (ref 15–41)
BILIRUBIN TOTAL: 1.7 mg/dL — AB (ref 0.3–1.2)
Bilirubin, Direct: 0.4 mg/dL (ref 0.1–0.5)
Indirect Bilirubin: 1.3 mg/dL — ABNORMAL HIGH (ref 0.3–0.9)
TOTAL PROTEIN: 5 g/dL — AB (ref 6.5–8.1)

## 2016-07-08 LAB — BASIC METABOLIC PANEL
Anion gap: 10 (ref 5–15)
BUN: 14 mg/dL (ref 6–20)
CHLORIDE: 105 mmol/L (ref 101–111)
CO2: 18 mmol/L — AB (ref 22–32)
CREATININE: 1.03 mg/dL — AB (ref 0.44–1.00)
Calcium: 8 mg/dL — ABNORMAL LOW (ref 8.9–10.3)
GFR calc non Af Amer: 51 mL/min — ABNORMAL LOW (ref >60)
GFR, EST AFRICAN AMERICAN: 60 mL/min — AB (ref >60)
Glucose, Bld: 103 mg/dL — ABNORMAL HIGH (ref 65–99)
POTASSIUM: 3.8 mmol/L (ref 3.5–5.1)
Sodium: 133 mmol/L — ABNORMAL LOW (ref 135–145)

## 2016-07-08 LAB — ACTH STIMULATION, 3 TIME POINTS
CORTISOL 30 MIN: 19.9 ug/dL
CORTISOL 60 MIN: 21.1 ug/dL
CORTISOL BASE: 14.8 ug/dL

## 2016-07-08 MED ORDER — DOCUSATE SODIUM 100 MG PO CAPS
100.0000 mg | ORAL_CAPSULE | Freq: Every day | ORAL | Status: DC
  Administered 2016-07-08 – 2016-07-09 (×2): 100 mg via ORAL
  Filled 2016-07-08 (×2): qty 1

## 2016-07-08 MED ORDER — POLYETHYLENE GLYCOL 3350 17 G PO PACK
17.0000 g | PACK | Freq: Every day | ORAL | Status: DC
  Administered 2016-07-08 – 2016-07-09 (×2): 17 g via ORAL
  Filled 2016-07-08 (×2): qty 1

## 2016-07-08 MED ORDER — SODIUM CHLORIDE 0.9 % IV SOLN
INTRAVENOUS | Status: AC
  Administered 2016-07-08 (×2): via INTRAVENOUS

## 2016-07-08 NOTE — Progress Notes (Signed)
PROGRESS NOTE    [name]  HCW:[id number] DOB: 1940/10/15 DOA: 07/06/2016 PCP: Merrilee Seashore, MD     Brief Narrative:  [name] is a 75 y.o. female w/ pmhx of chronic back pain, hx of lung CA, COPD who presented to ED with chief complaint of leg weakness. Patient has had leg weakness for 3 years. She is currently being worked up by Dr. Arnoldo Morale with neurosurgery. Patient's sister came to visit her and was concerned about patient's weakness. Patient has difficulty going from sitting to standing and standing to sitting. Patient's sister brought her to the emergency room for more complete evaluation. Patient had also complained of nausea with dry heaving over the last few weeks associated with poor oral intake. Patient does live at home alone.   Assessment & Plan:   Principal Problem:   Dehydration Active Problems:   Weakness of both lower extremities   Hyponatremia   Chronic back pain   Hyperbilirubinemia   Decreased oral intake   Hypotension   CKD (chronic kidney disease) stage 3, GFR 30-59 ml/min   HLD (hyperlipidemia)   Skin breakdown   Dehydration from poor oral intake -Improving with IVF. Advance diet to general.   Hyperbilirubinemia -Right upper quadrant ultrasound was negative. No abdominal pain. Suspect this was due to dehydration. Improving with IV fluids. Trend LFT   Hypotension -Could be secondary to dehydration. Hold isordil, maxide for hypotension. ACTH stim test negative. Asymptomatic. Check orthostatic VS   Mild hypovolemic hyponatremia -Stable   Asymptomatic pyuria -No dysuria, WBC 6-30 and rare bacteria in urine. No antibiotics.   Elevated TSH and elevated T4 -Not consistent with hypothyroidism. No previous hx of thyroid disease. Stop synthroid (started at time of admission). Should follow up with repeat labs in 4-6 weeks.   History of lung cancer -Previously treated with bilateral lobectomies > 10 years ago    CKD stage 3 -Baseline  creatinine around 1. Stable.   Chronic lower extremity weakness -Has been ongoing for 3 years. Follows with Dr. Arnoldo Morale (neurosurgery). Patient to follow-up with neurosurgery, PCP as outpatient. May possibly need neurology referral as outpatient.  -PT recommending CIR  HLD -Continue statin  LLE skin tear -States skin tore after compression stocking was taken off. Local wound care. Consult wound RN   DVT prophylaxis: lovenox  Code Status: Full Family Communication: spoke with sister at bedside  Disposition Plan: pending further work up and treatment. CIR evaluation    Consultants:   None  Procedures:   None  Antimicrobials:   None    Subjective: Doing well today. No new complaints. Denies any dizziness, lightheadedness, chest pain, shortness of breath, nausea, vomiting, diarrhea, abdominal pain, dysuria. Her main complaint remains chronic lower extremity weakness that has been ongoing for over 3 years.  Objective: Vitals:   07/07/16 2119 07/07/16 2224 07/08/16 0514 07/08/16 0858  BP: (!) 71/46 (!) 80/52 (!) 85/58 (!) 81/50  Pulse: 84  71 92  Resp: '16  15 16  '$ Temp: 97.4 F (36.3 C)  97.6 F (36.4 C) 98.3 F (36.8 C)  TempSrc: Oral  Oral Oral  SpO2: 92%  92% 90%  Weight: 49.3 kg (108 lb 11 oz)     Height:        Intake/Output Summary (Last 24 hours) at 07/08/16 1230 Last data filed at 07/08/16 1032  Gross per 24 hour  Intake          3234.58 ml  Output  500 ml  Net          2734.58 ml   Filed Weights   07/06/16 0919 07/06/16 2145 07/07/16 2119  Weight: 48.1 kg (106 lb) 49.1 kg (108 lb 3.9 oz) 49.3 kg (108 lb 11 oz)    Examination:  General exam: Appears calm and comfortable  Respiratory system: Clear to auscultation. Respiratory effort normal. Cardiovascular system: S1 & S2 heard, RRR. No JVD, murmurs, rubs, gallops or clicks. No pedal edema. Gastrointestinal system: Abdomen is nondistended, soft and nontender. No organomegaly or masses  felt. Normal bowel sounds heard. Central nervous system: Alert and oriented. No focal neurological deficits. Extremities: Symmetric 5 x 5 power. No focal weakness of LE on exam   Skin: +left shin with skin breakdown, blistering.  Psychiatry: Judgement and insight appear normal. Mood & affect appropriate.   Data Reviewed: I have personally reviewed following labs and imaging studies  CBC:  Recent Labs Lab 07/06/16 1056 07/07/16 0443  WBC 5.6 4.3  NEUTROABS 3.9  --   HGB 14.9 11.8*  HCT 43.5 35.6*  MCV 95.4 96.0  PLT 217 376   Basic Metabolic Panel:  Recent Labs Lab 07/06/16 1056 07/06/16 1413 07/07/16 0443 07/08/16 0703  NA 132*  --  134* 133*  K 3.7  --  3.6 3.8  CL 92*  --  100* 105  CO2 23  --  24 18*  GLUCOSE 88  --  79 103*  BUN 19  --  16 14  CREATININE 1.15*  --  1.06* 1.03*  CALCIUM 9.4  --  8.1* 8.0*  MG  --  1.4*  --   --   PHOS  --  3.9  --   --    GFR: Estimated Creatinine Clearance: 36.2 mL/min (by C-G formula based on SCr of 1.03 mg/dL (H)). Liver Function Tests:  Recent Labs Lab 07/06/16 1056 07/07/16 0800 07/08/16 0703  AST 84* 68* 49*  ALT 67* 52 42  ALKPHOS 56 42 41  BILITOT 3.2* 2.4* 1.7*  PROT 6.7 4.8* 5.0*  ALBUMIN 3.7 2.6* 2.6*   No results for input(s): LIPASE, AMYLASE in the last 168 hours. No results for input(s): AMMONIA in the last 168 hours. Coagulation Profile: No results for input(s): INR, PROTIME in the last 168 hours. Cardiac Enzymes: No results for input(s): CKTOTAL, CKMB, CKMBINDEX, TROPONINI in the last 168 hours. BNP (last 3 results) No results for input(s): PROBNP in the last 8760 hours. HbA1C: No results for input(s): HGBA1C in the last 72 hours. CBG: No results for input(s): GLUCAP in the last 168 hours. Lipid Profile: No results for input(s): CHOL, HDL, LDLCALC, TRIG, CHOLHDL, LDLDIRECT in the last 72 hours. Thyroid Function Tests:  Recent Labs  07/06/16 1032 07/06/16 1413  TSH 31.177*  --   FREET4  --   1.57*   Anemia Panel: No results for input(s): VITAMINB12, FOLATE, FERRITIN, TIBC, IRON, RETICCTPCT in the last 72 hours. Sepsis Labs:  Recent Labs Lab 07/06/16 1252  LATICACIDVEN 1.05    No results found for this or any previous visit (from the past 240 hour(s)).     Radiology Studies: US Abdomen Limited Ruq  Result Date: 07/06/2016 CLINICAL DATA:  Patient with hyperbilirubinemia.  Leg weakness. EXAM: US ABDOMEN LIMITED - RIGHT UPPER QUADRANT COMPARISON:  None. FINDINGS: Gallbladder: No gallstones or wall thickening visualized. No sonographic Murphy sign noted by sonographer. Common bile duct: Diameter: 7 mm Liver: No focal lesion identified. Increased hepatic parenchymal echogenicity IMPRESSION: Unremarkable right  upper quadrant ultrasound. No cholelithiasis or sonographic evidence of acute cholecystitis. Mild hepatic steatosis. Electronically Signed   By: Lovey Newcomer M.D.   On: 07/06/2016 14:50      Scheduled Meds: . atorvastatin  40 mg Oral q1800  . bacitracin   Topical BID  . docusate sodium  100 mg Oral Daily  . enoxaparin (LOVENOX) injection  40 mg Subcutaneous Q24H  . polyethylene glycol  17 g Oral Daily   Continuous Infusions: . sodium chloride 125 mL/hr at 07/08/16 0830     LOS: 1 day    Time spent: 85mnutes   JDessa Phi DO [facility] [email or web address] Password [email or web address] 07/08/2016, 12:30 PM

## 2016-07-09 ENCOUNTER — Encounter (HOSPITAL_COMMUNITY): Payer: Self-pay | Admitting: Physical Medicine and Rehabilitation

## 2016-07-09 DIAGNOSIS — Z85118 Personal history of other malignant neoplasm of bronchus and lung: Secondary | ICD-10-CM

## 2016-07-09 DIAGNOSIS — E871 Hypo-osmolality and hyponatremia: Secondary | ICD-10-CM | POA: Diagnosis not present

## 2016-07-09 DIAGNOSIS — M545 Low back pain: Secondary | ICD-10-CM | POA: Diagnosis not present

## 2016-07-09 DIAGNOSIS — G8929 Other chronic pain: Secondary | ICD-10-CM

## 2016-07-09 DIAGNOSIS — N183 Chronic kidney disease, stage 3 (moderate): Secondary | ICD-10-CM | POA: Diagnosis not present

## 2016-07-09 DIAGNOSIS — L909 Atrophic disorder of skin, unspecified: Secondary | ICD-10-CM

## 2016-07-09 DIAGNOSIS — D649 Anemia, unspecified: Secondary | ICD-10-CM | POA: Diagnosis not present

## 2016-07-09 DIAGNOSIS — R5381 Other malaise: Secondary | ICD-10-CM

## 2016-07-09 DIAGNOSIS — R29898 Other symptoms and signs involving the musculoskeletal system: Secondary | ICD-10-CM | POA: Diagnosis not present

## 2016-07-09 DIAGNOSIS — M4802 Spinal stenosis, cervical region: Secondary | ICD-10-CM | POA: Diagnosis not present

## 2016-07-09 DIAGNOSIS — E784 Other hyperlipidemia: Secondary | ICD-10-CM | POA: Diagnosis not present

## 2016-07-09 DIAGNOSIS — I959 Hypotension, unspecified: Secondary | ICD-10-CM

## 2016-07-09 DIAGNOSIS — R638 Other symptoms and signs concerning food and fluid intake: Secondary | ICD-10-CM | POA: Diagnosis not present

## 2016-07-09 DIAGNOSIS — E440 Moderate protein-calorie malnutrition: Secondary | ICD-10-CM | POA: Diagnosis not present

## 2016-07-09 DIAGNOSIS — D631 Anemia in chronic kidney disease: Secondary | ICD-10-CM | POA: Diagnosis not present

## 2016-07-09 DIAGNOSIS — M1990 Unspecified osteoarthritis, unspecified site: Secondary | ICD-10-CM | POA: Diagnosis not present

## 2016-07-09 DIAGNOSIS — R110 Nausea: Secondary | ICD-10-CM | POA: Diagnosis not present

## 2016-07-09 DIAGNOSIS — M6281 Muscle weakness (generalized): Secondary | ICD-10-CM | POA: Diagnosis not present

## 2016-07-09 DIAGNOSIS — D638 Anemia in other chronic diseases classified elsewhere: Secondary | ICD-10-CM

## 2016-07-09 DIAGNOSIS — E46 Unspecified protein-calorie malnutrition: Secondary | ICD-10-CM

## 2016-07-09 DIAGNOSIS — G8220 Paraplegia, unspecified: Secondary | ICD-10-CM | POA: Diagnosis not present

## 2016-07-09 DIAGNOSIS — E8809 Other disorders of plasma-protein metabolism, not elsewhere classified: Secondary | ICD-10-CM

## 2016-07-09 DIAGNOSIS — R600 Localized edema: Secondary | ICD-10-CM | POA: Diagnosis not present

## 2016-07-09 DIAGNOSIS — E860 Dehydration: Principal | ICD-10-CM

## 2016-07-09 DIAGNOSIS — J449 Chronic obstructive pulmonary disease, unspecified: Secondary | ICD-10-CM | POA: Diagnosis not present

## 2016-07-09 DIAGNOSIS — E785 Hyperlipidemia, unspecified: Secondary | ICD-10-CM | POA: Diagnosis not present

## 2016-07-09 DIAGNOSIS — R531 Weakness: Secondary | ICD-10-CM | POA: Diagnosis not present

## 2016-07-09 LAB — BASIC METABOLIC PANEL
Anion gap: 5 (ref 5–15)
BUN: 12 mg/dL (ref 6–20)
CALCIUM: 8 mg/dL — AB (ref 8.9–10.3)
CO2: 24 mmol/L (ref 22–32)
CREATININE: 1.11 mg/dL — AB (ref 0.44–1.00)
Chloride: 108 mmol/L (ref 101–111)
GFR calc Af Amer: 54 mL/min — ABNORMAL LOW (ref >60)
GFR, EST NON AFRICAN AMERICAN: 47 mL/min — AB (ref >60)
GLUCOSE: 108 mg/dL — AB (ref 65–99)
POTASSIUM: 3.6 mmol/L (ref 3.5–5.1)
Sodium: 137 mmol/L (ref 135–145)

## 2016-07-09 MED ORDER — SODIUM CHLORIDE 0.9 % IV SOLN
INTRAVENOUS | Status: DC
  Administered 2016-07-09: 12:00:00 via INTRAVENOUS

## 2016-07-09 MED ORDER — BACITRACIN ZINC 500 UNIT/GM EX OINT
TOPICAL_OINTMENT | Freq: Every day | CUTANEOUS | Status: DC
Start: 2016-07-10 — End: 2016-07-09
  Filled 2016-07-09: qty 28.35

## 2016-07-09 NOTE — Clinical Social Work Placement (Signed)
   CLINICAL SOCIAL WORK PLACEMENT  NOTE 07/09/16 - DISCHARGED TO [place]  Date:  07/09/2016  Patient Details  Name: [name] MRN: [id number] Date of Birth: 12-02-1940  Clinical Social Work is seeking post-discharge placement for this patient at the [place] level of care (*CSW will initial, date and re-position this form in  chart as items are completed):  Yes   Patient/family provided with [place] Work Department's list of facilities offering this level of care within the geographic area requested by the patient (or if unable, by the patient's family).  Yes   Patient/family informed of their freedom to choose among providers that offer the needed level of care, that participate in Medicare, Medicaid or managed care program needed by the patient, have an available bed and are willing to accept the patient.  Yes   Patient/family informed of [facility]'s ownership interest in [facility] and [facility], as well as of the fact that they are under no obligation to receive care at these facilities.  PASRR submitted to EDS on 07/09/16     PASRR number received on 07/09/16     Existing PASRR number confirmed on       FL2 transmitted to all facilities in geographic area requested by pt/family on 07/09/16     FL2 transmitted to all facilities within larger geographic area on       Patient informed that his/her managed care company has contracts with or will negotiate with certain facilities, including the following:        Yes   Patient/family informed of bed offers received.  Patient chooses bed at [facility]     Physician recommends and patient chooses bed at      Patient to be transferred to [facility] on 07/09/16.  Patient to be transferred to facility by [facility])     Patient family notified on 07/09/16 of transfer.  Name of family member notified:  Lawernce Keas  - son.  Phone [phone].     PHYSICIAN       Additional Comment:    _______________________________________________ Sable Feil, LCSW 07/09/2016, 5:16 PM

## 2016-07-09 NOTE — Evaluation (Signed)
Occupational Therapy Evaluation Patient Details Name: [name] MRN: [id number] DOB: Jul 11, 1941 Today's Date: 07/09/2016    History of Present Illness Patient is a 75 yo female admitted 07/06/16 with BLE weakness, dehydration, hypotension, gait disturbance.   PMH:  chronic back pain, back surgeries, lung CA, COPD, CKD, HLD, LE weakness   Clinical Impression   Pt's baseline in modified independent in ADL and IADL. Pt presents with generalized weakness, decreased activity tolerance and poor standing balance. Pt is not typically on 02. Pt has the potential to return home at her basline with intensive rehab. Recommending CIR.   Follow Up Recommendations  CIR    Equipment Recommendations       Recommendations for Other Services       Precautions / Restrictions Precautions Precautions: Fall Restrictions Weight Bearing Restrictions: No      Mobility Bed Mobility               General bed mobility comments: pt in chair  Transfers Overall transfer level: Needs assistance Equipment used: Rolling walker (2 wheeled) Transfers: Sit to/from Stand Sit to Stand: Min guard         General transfer comment: cues for hand placement from recliner    Balance             Standing balance-Leahy Scale: Poor                              ADL Overall ADL's : Needs assistance/impaired Eating/Feeding: Independent;Sitting Eating/Feeding Details (indicate cue type and reason): including opening containers Grooming: Wash/dry hands;Standing;Min guard   Upper Body Bathing: Set up;Sitting   Lower Body Bathing: Minimal assistance;Sit to/from stand   Upper Body Dressing : Set up;Sitting   Lower Body Dressing: Minimal assistance;Sit to/from stand   Toilet Transfer: Min guard;Ambulation;RW;BSC   Toileting- Water quality scientist and Hygiene: Min guard;Sit to/from stand       Functional mobility during ADLs: Min guard;Rolling walker General ADL Comments:  able to cross foot over opposite knee     Vision     Perception     Praxis      Pertinent Vitals/Pain Pain Assessment: No/denies pain     Hand Dominance Right   Extremity/Trunk Assessment Upper Extremity Assessment Upper Extremity Assessment: Generalized weakness (moderate edema in R UE, minimal in L)   Lower Extremity Assessment Lower Extremity Assessment: Defer to PT evaluation       Communication Communication Communication: 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)   Cognition Arousal/Alertness: Awake/alert Behavior During Therapy: WFL for tasks assessed/performed Overall Cognitive Status: Within Functional Limits for tasks assessed                     General Comments       Exercises       Shoulder Instructions      Home Living Family/patient expects to be discharged to:: Private residence Living Arrangements: Alone Available Help at Discharge: Friend(s);Family;Available PRN/intermittently (family lives 2-3 hours away) Type of Home: Mobile home Home Access: Stairs to enter [employer] of Steps: 4 Entrance Stairs-Rails: Right;Left Home Layout: One level     Bathroom Shower/Tub: Teacher, early years/pre: Standard     Home Equipment: Environmental consultant - 2 wheels;Walker - 4 wheels;Bedside commode;Shower seat          Prior Functioning/Environment Level of Independence: Independent;Independent with assistive device(s)        Comments: Independent prior to decline  in LE strength. Using rollator for gait.  Driving until 3 weeks pta.        OT Problem List: Decreased strength;Decreased activity tolerance;Impaired balance (sitting and/or standing);Decreased knowledge of use of DME or AE;Cardiopulmonary status limiting activity;Increased edema   OT Treatment/Interventions: Self-care/ADL training;Therapeutic exercise;DME and/or AE instruction;Patient/family education;Balance training;Therapeutic activities;Energy conservation    OT Goals(Current  goals can be found in the care plan section) Acute Rehab OT Goals Patient Stated Goal: To be able to return home safely OT Goal Formulation: With patient Time For Goal Achievement: 07/23/16 Potential to Achieve Goals: Good ADL Goals Pt Will Perform Grooming: with modified independence;standing Pt Will Perform Lower Body Bathing: with modified independence;sit to/from stand Pt Will Perform Lower Body Dressing: with modified independence;sit to/from stand Pt Will Transfer to Toilet: with modified independence;ambulating Pt Will Perform Toileting - Clothing Manipulation and hygiene: with modified independence;sit to/from stand Pt Will Perform Tub/Shower Transfer: Tub transfer;ambulating;shower seat;rolling walker;with modified independence Pt/caregiver will Perform Home Exercise Program: Increased strength;Both right and left upper extremity;With Supervision Additional ADL Goal #1: Pt will generalize energy conservation and breathing techniques in ADL independently.  OT Frequency: Min 2X/week   Barriers to D/C:            Co-evaluation              End of Session Equipment Utilized During Treatment: Gait belt;Rolling walker  Activity Tolerance: Patient tolerated treatment well Patient left: in chair;with call bell/phone within reach   Time: 0848-0920 OT Time Calculation (min): 32 min Charges:  OT General Charges $OT Visit: 1 Procedure OT Evaluation $OT Eval Moderate Complexity: 1 Procedure OT Treatments $Self Care/Home Management : 8-22 mins G-Codes:    Malka So 07/09/2016, 9:22 AM  [phone]

## 2016-07-09 NOTE — Discharge Summary (Signed)
Physician Discharge Summary  [name] EQA:[id number] DOB: 1940-10-07 DOA: 07/06/2016  PCP: Merrilee Seashore, MD  Admit date: 07/06/2016 Discharge date: 07/09/2016  Admitted From: Home Disposition:  SNF  Recommendations for Outpatient Follow-up:  1. Follow up with PCP in 1-2 weeks 2. You will need repeat thyroid studies as outpatient in 4-6 weeks.  3. We have stopped your blood pressure medications as you were hypotensive while in hospital. Follow up with PCP.   Home Health: No  Equipment/Devices: None   Discharge Condition: Stable CODE STATUS: Full  Diet recommendation: Regular  Brief/Interim Summary: From H&P: [name] a 75 y.o.femalew/ pmhx of chronic back pain, hx of lung CA, COPD who presented to ED with chief complaint of leg weakness.Patient has had leg weakness for 3 years. She is currently being worked up by Dr. Arnoldo Morale with neurosurgery, although recent imaging did not reveal clear source of chronic weakness. Patient's sister came to visit her and was concerned about patient's weakness. Patient has difficulty going from sitting to standing and standing to sitting. Patient's sister brought her to the emergency room for more complete evaluation. Patient had also complained of nausea with dry heaving over the last few weeks associated with poor oral intake. Patient does live at home alone.  Interim: Patient had dehydration as well as hyperbilirubinemia, mild hypovolemic hyponatremia on admission, likely due to poor oral intake at home. These all resolved with IV fluids. During admission, patient was quite hypotensive, which was at first felt to be due to dehydration. Her Isordil and Maxzide were held. ACTH stim test was negative. Patient remained asymptomatic with her low blood pressure. Patient had good oral intake while in the hospital.  Subjective on day of discharge: Doing well. No complaints. Denies any dizziness, lightheadedness upon standing. No chest  pain or shortness of breath. Denies any abdominal pain, nausea, vomiting or diarrhea. Continues to be weak and working with physical therapy. Inpatient rehabilitation was recommended, but patient did not qualify. She was agreeable to skilled nursing facility.  Discharge Diagnoses:  Principal Problem:   Dehydration Active Problems:   Weakness of both lower extremities   Hyponatremia   Chronic back pain   Hyperbilirubinemia   Decreased oral intake   Hypotension   CKD (chronic kidney disease) stage 3, GFR 30-59 ml/min   HLD (hyperlipidemia)   Skin breakdown   Chronic obstructive pulmonary disease (HCC)   History of lung cancer   Spinal stenosis in cervical region   Hypoalbuminemia due to protein-calorie malnutrition (HCC)   Anemia of chronic disease   Debility   Dehydration from poor oral intake -Improving with IVF. Advance diet to general.   Hyperbilirubinemia -Right upper quadrant ultrasound was negative. No abdominal pain. Suspect this was due to dehydration. Improving with IV fluids. Trend LFT   Hypotension -Could be secondary to dehydration. Hold isordil, maxide for hypotension. ACTH stim test negative. Asymptomatic. Orthostatic VS negative.   Mild hypovolemic hyponatremia -Resolved  Asymptomatic pyuria -No dysuria, WBC 6-30 and rare bacteria in urine. No antibiotics.   Elevated TSH and elevated T4 -Not consistent with hypothyroidism. No previous hx of thyroid disease. Stop synthroid (started at time of admission). Should follow up with repeat labs in 4-6 weeks.   History of lung cancer -Previously treated with bilateral lobectomies > 10 years ago    CKD stage 3 -Baseline creatinine around 1. Stable.   Chronic lower extremity weakness -Has been ongoing for 3 years. Follows with Dr. Arnoldo Morale (neurosurgery). Patient to follow-up with neurosurgery, PCP  as outpatient. May possibly need neurology referral as outpatient.  -PT /OT evaluated   HLD -Continue  statin  LLE skin tear -States skin tore after compression stocking was taken off. Local wound care.  Discharge Instructions  Discharge Instructions    Diet - low sodium heart healthy    Complete by:  As directed    Discharge instructions    Complete by:  As directed    Recommendations for Outpatient Follow-up:  Follow up with PCP in 1-2 weeks You will need repeat thyroid studies as outpatient.  We have stopped your blood pressure medications as you were hypotensive while in hospital. Follow up with PCP.   Increase activity slowly    Complete by:  As directed        Medication List    STOP taking these medications   isosorbide dinitrate 5 MG tablet Commonly known as:  ISORDIL   potassium chloride SA 20 MEQ tablet Commonly known as:  K-DUR,KLOR-CON   triamterene-hydrochlorothiazide 37.5-25 MG tablet Commonly known as:  MAXZIDE-25     TAKE these medications   aspirin 81 MG tablet Take 81 mg by mouth daily.   atorvastatin 40 MG tablet Commonly known as:  LIPITOR Take 40 mg by mouth daily.   Biotin 5000 MCG Caps Take 5,000 mcg by mouth daily.   tiZANidine 4 MG tablet Commonly known as:  ZANAFLEX Take 2-4 mg by mouth every 8 (eight) hours as needed for muscle spasms.   Vitamin D 2000 units tablet Take 2,000 Units by mouth daily.      Follow-up Information    RAMACHANDRAN,AJITH, MD. Schedule an appointment as soon as possible for a visit in 1 week(s).   Specialty:  Internal Medicine Contact information: [place] [address] [phone]          Allergies  Allergen Reactions  . Sulfa Antibiotics Hives    Consultations:  None    Procedures/Studies: Ct Lumbar Spine W Contrast  Result Date: 06/19/2016 CLINICAL DATA:  Spondylosis without myelopathy. Previous lumbar fusion. Severe bilateral leg weakness attributed to spinal disease. EXAM: LUMBAR MYELOGRAM FLUOROSCOPY TIME:  0 minutes 59 seconds. 135.99 micro gray  meter squared PROCEDURE: After thorough discussion of risks and benefits of the procedure including bleeding, infection, injury to nerves, blood vessels, adjacent structures as well as headache and CSF leak, written and oral informed consent was obtained. Consent was obtained by Dr. Nelson Chimes. Time out form was completed. Patient was positioned prone on the fluoroscopy table. Local anesthesia was provided with 1% lidocaine without epinephrine after prepped and draped in the usual sterile fashion. Puncture was performed at L5-S1 using a 3 1/2 inch 22-gauge spinal needle via left para median approach. Using a single pass through the dura, the needle was placed within the thecal sac, with return of clear CSF. 15 mL of Isovue 200 was injected into the thecal sac, with normal opacification of the nerve roots and cauda equina consistent with free flow within the subarachnoid space. I personally performed the lumbar puncture and administered the intrathecal contrast. I also personally performed acquisition of the myelogram images. TECHNIQUE: Contiguous axial images were obtained through the Lumbar spine after the intrathecal infusion of infusion. Coronal and sagittal reconstructions were obtained of the axial image sets. COMPARISON:  Radiography 06/05/2016.  MRI 06/06/2015. FINDINGS: LUMBAR MYELOGRAM FINDINGS: Thoracolumbar curvature convex to the left. Previous diskectomy, decompression and fusion from L3 through L5. No compressive central canal stenosis. Left lateral recess narrowing at L1-2  and L2-3. Anterior extradural defects at T12-L1, L1-2 and L2-3. Disc degeneration at L5-S1 but without evidence of stenosis. Standing flexion extension views do not show any abnormal motion. CT LUMBAR MYELOGRAM FINDINGS: T12-L1: Degenerative desiccation of the disc with vacuum phenomenon. Mild bulging of the disc. No compressive stenosis. L1-2: Degenerative desiccation of the disc with vacuum phenomenon. More loss of height on the  left. Endplate osteophytes and bulging of the disc. Mild narrowing of the left lateral recess and intervertebral foramen on the left but without definite neural compression. L2-3: Disc degeneration with vacuum phenomenon. More loss of height on the left. Endplate osteophytes and shallow protrusion of the disc. Facet and ligamentous hypertrophy. Narrowing of both lateral recesses and foramina, left more than right. Neural compression could occur on the left. L3 through L5: Previous diskectomy, decompression and fusion. Fusion is solid. Wide patency of the canal and foramina. L5-S1: Disc degeneration of vacuum phenomenon. Disc degeneration is more pronounced on the right. Endplate osteophytes and mild bulging of the disc. No central canal stenosis. Foraminal narrowing on the right that could possibly affect the L5 nerve root. IMPRESSION: Solid fusion from L3 through L5. Wide patency of the canal and foramina in that segment. There is no severe or significant central canal stenosis in the region studied. Curvature of the thoracolumbar region convex to the right. Left-sided predominant disc degeneration and disc space narrowing at L1-2 and L2-3, with narrowing of the lateral recesses and foramina left more than right. There is some potential for neural compression on the left, particularly at the L2-3 level. Disc degeneration at L5-S1 more pronounced on the right. Foraminal narrowing on the right because of encroachment by osteophyte and bulging disc material that could possibly affect the exiting right L5 nerve root. Electronically Signed   By: Nelson Chimes M.D.   On: 06/19/2016 14:13   Mr Cervical Spine Wo Contrast  Result Date: 06/22/2016 CLINICAL DATA:  Bilateral hand numbness.  Weakness in both legs EXAM: MRI CERVICAL SPINE WITHOUT CONTRAST TECHNIQUE: Multiplanar, multisequence MR imaging of the cervical spine was performed. No intravenous contrast was administered. COMPARISON:  CT cervical spine 10/27/2011.  FINDINGS: Alignment: Slight C3-4 and C4-5 anterolisthesis, facet mediated. Vertebrae: Heterogeneous marrow without fracture, evidence of discitis, or focal bone lesion. Cord: Normal signal and morphology. Posterior Fossa, vertebral arteries, paraspinal tissues: Patchy T2 hyperintensity in the pons, usually chronic microvascular disease. Disc levels: C2-3: Disc narrowing and right uncovertebral ridging. Facet arthropathy with spurring greater on the right. Moderate range right foraminal stenosis. Patent canal and left foramen. C3-4: Facet arthropathy with bulky spurring on the right. Slight anterolisthesis. Mild annulus bulging. At least moderate right foraminal stenosis. C4-5: Facet arthropathy with bulky spurring on the right. Slight anterolisthesis. Mild disc narrowing and bulging. Patent canal and bilateral foramina. C5-6: Posterior disc osteophyte complex with left larger than right uncovertebral ridging. Negative facets. Advanced left and moderate right foraminal stenosis. Ventral subarachnoid space effacement. C6-7: Disc narrowing with bilateral uncovertebral ridging. Negative facets. Moderate left more than right foraminal stenosis. C7-T1:Disc narrowing with left foraminal protrusion on sagittal acquisition. Endplate ridging at the right foramen. Moderate right and advanced left foraminal stenosis. IMPRESSION: 1. Advanced upper cervical facet arthropathy and lower cervical disc degeneration. 2. Patent canal.  No evidence of myelopathy. 3. 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 4. C5-6 bilateral foraminal stenosis with C6 impingement greater on the left. 5. C6-7 moderate bilateral foraminal stenosis, worse on the left. 6. C7-T1 biforaminal stenosis with C8 impingement greater on  the left. Electronically Signed   By: Monte Fantasia M.D.   On: 06/22/2016 10:28   Dg Myelography Lumbar Inj Lumbosacral  Result Date: 06/19/2016 CLINICAL DATA:  Spondylosis without myelopathy. Previous lumbar fusion.  Severe bilateral leg weakness attributed to spinal disease. EXAM: LUMBAR MYELOGRAM FLUOROSCOPY TIME:  0 minutes 59 seconds. 135.99 micro gray meter squared PROCEDURE: After thorough discussion of risks and benefits of the procedure including bleeding, infection, injury to nerves, blood vessels, adjacent structures as well as headache and CSF leak, written and oral informed consent was obtained. Consent was obtained by Dr. Nelson Chimes. Time out form was completed. Patient was positioned prone on the fluoroscopy table. Local anesthesia was provided with 1% lidocaine without epinephrine after prepped and draped in the usual sterile fashion. Puncture was performed at L5-S1 using a 3 1/2 inch 22-gauge spinal needle via left para median approach. Using a single pass through the dura, the needle was placed within the thecal sac, with return of clear CSF. 15 mL of Isovue 200 was injected into the thecal sac, with normal opacification of the nerve roots and cauda equina consistent with free flow within the subarachnoid space. I personally performed the lumbar puncture and administered the intrathecal contrast. I also personally performed acquisition of the myelogram images. TECHNIQUE: Contiguous axial images were obtained through the Lumbar spine after the intrathecal infusion of infusion. Coronal and sagittal reconstructions were obtained of the axial image sets. COMPARISON:  Radiography 06/05/2016.  MRI 06/06/2015. FINDINGS: LUMBAR MYELOGRAM FINDINGS: Thoracolumbar curvature convex to the left. Previous diskectomy, decompression and fusion from L3 through L5. No compressive central canal stenosis. Left lateral recess narrowing at L1-2 and L2-3. Anterior extradural defects at T12-L1, L1-2 and L2-3. Disc degeneration at L5-S1 but without evidence of stenosis. Standing flexion extension views do not show any abnormal motion. CT LUMBAR MYELOGRAM FINDINGS: T12-L1: Degenerative desiccation of the disc with vacuum phenomenon.  Mild bulging of the disc. No compressive stenosis. L1-2: Degenerative desiccation of the disc with vacuum phenomenon. More loss of height on the left. Endplate osteophytes and bulging of the disc. Mild narrowing of the left lateral recess and intervertebral foramen on the left but without definite neural compression. L2-3: Disc degeneration with vacuum phenomenon. More loss of height on the left. Endplate osteophytes and shallow protrusion of the disc. Facet and ligamentous hypertrophy. Narrowing of both lateral recesses and foramina, left more than right. Neural compression could occur on the left. L3 through L5: Previous diskectomy, decompression and fusion. Fusion is solid. Wide patency of the canal and foramina. L5-S1: Disc degeneration of vacuum phenomenon. Disc degeneration is more pronounced on the right. Endplate osteophytes and mild bulging of the disc. No central canal stenosis. Foraminal narrowing on the right that could possibly affect the L5 nerve root. IMPRESSION: Solid fusion from L3 through L5. Wide patency of the canal and foramina in that segment. There is no severe or significant central canal stenosis in the region studied. Curvature of the thoracolumbar region convex to the right. Left-sided predominant disc degeneration and disc space narrowing at L1-2 and L2-3, with narrowing of the lateral recesses and foramina left more than right. There is some potential for neural compression on the left, particularly at the L2-3 level. Disc degeneration at L5-S1 more pronounced on the right. Foraminal narrowing on the right because of encroachment by osteophyte and bulging disc material that could possibly affect the exiting right L5 nerve root. Electronically Signed   By: Nelson Chimes M.D.   On: 06/19/2016  14:13   US Abdomen Limited Ruq  Result Date: 07/06/2016 CLINICAL DATA:  Patient with hyperbilirubinemia.  Leg weakness. EXAM: US ABDOMEN LIMITED - RIGHT UPPER QUADRANT COMPARISON:  None. FINDINGS:  Gallbladder: No gallstones or wall thickening visualized. No sonographic Murphy sign noted by sonographer. Common bile duct: Diameter: 7 mm Liver: No focal lesion identified. Increased hepatic parenchymal echogenicity IMPRESSION: Unremarkable right upper quadrant ultrasound. No cholelithiasis or sonographic evidence of acute cholecystitis. Mild hepatic steatosis. Electronically Signed   By: Lovey Newcomer M.D.   On: 07/06/2016 14:50      Discharge Exam: Vitals:   07/09/16 0420 07/09/16 0942  BP: (!) 96/56 (!) 85/51  Pulse:  79  Resp:  18  Temp:  97.9 F (36.6 C)   Vitals:   07/08/16 2054 07/09/16 0414 07/09/16 0420 07/09/16 0942  BP:  (!) 76/48 (!) 96/56 (!) 85/51  Pulse:  70  79  Resp:  17  18  Temp:  97.8 F (36.6 C)  97.9 F (36.6 C)  TempSrc:  Oral  Oral  SpO2: 94% 97%  94%  Weight:      Height:        General exam: Appears calm and comfortable  Respiratory system: Clear to auscultation. Respiratory effort normal. Cardiovascular system: S1 & S2 heard, RRR. No JVD, murmurs, rubs, gallops or clicks. No pedal edema. Gastrointestinal system: Abdomen is nondistended, soft and nontender. No organomegaly or masses felt. Normal bowel sounds heard. Central nervous system: Alert and oriented. No focal neurological deficits. Extremities: Symmetric 5 x 5 power. No focal weakness of LE on exam   Skin: +left shin with skin breakdown, blistering.  Psychiatry: Judgement and insight appear normal. Mood & affect appropriate.    The results of significant diagnostics from this hospitalization (including imaging, microbiology, ancillary and laboratory) are listed below for reference.     Microbiology: No results found for this or any previous visit (from the past 240 hour(s)).   Labs: BNP (last 3 results) No results for input(s): BNP in the last 8760 hours. Basic Metabolic Panel:  Recent Labs Lab 07/06/16 1056 07/06/16 1413 07/07/16 0443 07/08/16 0703 07/09/16 0606  NA 132*  --   134* 133* 137  K 3.7  --  3.6 3.8 3.6  CL 92*  --  100* 105 108  CO2 23  --  24 18* 24  GLUCOSE 88  --  79 103* 108*  BUN 19  --  '16 14 12  '$ CREATININE 1.15*  --  1.06* 1.03* 1.11*  CALCIUM 9.4  --  8.1* 8.0* 8.0*  MG  --  1.4*  --   --   --   PHOS  --  3.9  --   --   --    Liver Function Tests:  Recent Labs Lab 07/06/16 1056 07/07/16 0800 07/08/16 0703  AST 84* 68* 49*  ALT 67* 52 42  ALKPHOS 56 42 41  BILITOT 3.2* 2.4* 1.7*  PROT 6.7 4.8* 5.0*  ALBUMIN 3.7 2.6* 2.6*   No results for input(s): LIPASE, AMYLASE in the last 168 hours. No results for input(s): AMMONIA in the last 168 hours. CBC:  Recent Labs Lab 07/06/16 1056 07/07/16 0443  WBC 5.6 4.3  NEUTROABS 3.9  --   HGB 14.9 11.8*  HCT 43.5 35.6*  MCV 95.4 96.0  PLT 217 166   Cardiac Enzymes: No results for input(s): CKTOTAL, CKMB, CKMBINDEX, TROPONINI in the last 168 hours. BNP: Invalid input(s): POCBNP CBG: No results for input(s):  GLUCAP in the last 168 hours. D-Dimer No results for input(s): DDIMER in the last 72 hours. Hgb A1c No results for input(s): HGBA1C in the last 72 hours. Lipid Profile No results for input(s): CHOL, HDL, LDLCALC, TRIG, CHOLHDL, LDLDIRECT in the last 72 hours. Thyroid function studies No results for input(s): TSH, T4TOTAL, T3FREE, THYROIDAB in the last 72 hours.  Invalid input(s): FREET3 Anemia work up No results for input(s): VITAMINB12, FOLATE, FERRITIN, TIBC, IRON, RETICCTPCT in the last 72 hours. Urinalysis    Component Value Date/Time   COLORURINE RED (A) 07/06/2016 1947   APPEARANCEUR CLOUDY (A) 07/06/2016 1947   LABSPEC 1.018 07/06/2016 1947   PHURINE 6.0 07/06/2016 1947   GLUCOSEU NEGATIVE 07/06/2016 1947   HGBUR LARGE (A) 07/06/2016 1947   BILIRUBINUR MODERATE (A) 07/06/2016 1947   KETONESUR 15 (A) 07/06/2016 1947   PROTEINUR 100 (A) 07/06/2016 1947   NITRITE NEGATIVE 07/06/2016 1947   LEUKOCYTESUR MODERATE (A) 07/06/2016 1947   Sepsis Labs Invalid  input(s): PROCALCITONIN,  WBC,  LACTICIDVEN Microbiology No results found for this or any previous visit (from the past 240 hour(s)).   Time coordinating discharge: Over 30 minutes  SIGNED:  Dessa Phi, DO [facility] Pager [phone]  If 7PM-7AM, please contact night-coverage [email or web address] Password [email or web address] 07/09/2016, 2:34 PM

## 2016-07-09 NOTE — Progress Notes (Signed)
I met with pt at bedside to discuss our recommendations for SNF rehab. Pt is in agreement. SW is involved. We will sign off. [phone]

## 2016-07-09 NOTE — Consult Note (Signed)
Physical Medicine and Rehabilitation Consult   Reason for Consult: Weakness Referring Physician: Dr. Maylene Roes   HPI: [name] is a 75 y.o. female with history of chronic LBP, COPD, lung cancer s/p RUL lobectomy 1999 and LLL lobectomy 2007, BLE and bilateral hand weakness with recent work up revealing advanced cervical arthropathy with cervical stenosis but not enough to explain symptoms per NS. She was admitted on 07/06/16 with weakness, N/V x 3 weeks with poor po intake and progressive weakness with difficulty walking and getting in and out of chair.  She was noted to be dehydrated with hyponatremia and elevated bilirubin levels. She was started on IVFfor dehydration but noted to have continued issues with significant hypotension. PT evaluation done revealing BLE with buckling. weakness and shuffling gait. CIR recommended for follow up therapy.    She lives alone and does not have family locally. Was ambulating with rollater when she started having GI symptoms with poor intake--reports that one mixed drink helped alleviate her symptoms.   Has been getting weaker for past 6 months--unable to walk length of house without resting.     Review of Systems  HENT: Negative for hearing loss.   Eyes: Negative for blurred vision and double vision.  Respiratory: Positive for shortness of breath and wheezing.   Cardiovascular: Positive for leg swelling (wears support stockings at home). Negative for chest pain and palpitations.  Gastrointestinal: Positive for nausea (for the past 3 weeks. ).  Genitourinary: Positive for urgency. Negative for dysuria.  Musculoskeletal: Positive for back pain.       RLE weakness with muscle spasms.   Skin: Negative for itching and rash.  Neurological: Positive for sensory change (bilateral hands get numb when resting. ), focal weakness (RLE>LLE weakness) and weakness. Negative for dizziness and headaches.  Psychiatric/Behavioral: Negative for memory loss. The  patient does not have insomnia.   All other systems reviewed and are negative.   Past Medical History:  Diagnosis Date  . Arthritis   . Chronic back pain   . COPD (chronic obstructive pulmonary disease) ([place])   . Depression   . Dyspnea   . lt lung ca dx'd 2007   lu-lobectomy  . rt lung ca dx'd 2001   ru-lobectomy    Past Surgical History:  Procedure Laterality Date  . CARPAL TUNNEL RELEASE    . LUNG CANCER SURGERY    . TONSILLECTOMY      Family History  Problem Relation Age of Onset  . Family history unknown: Yes    Social History:  Widowed. Independent PTA. She  reports that she quit smoking about 16 years ago. She has never used smokeless tobacco. Per reports  she drinks 2 shots of liquor daily.  She reports that she does not use drugs.    Allergies  Allergen Reactions  . Sulfa Antibiotics Hives    Medications Prior to Admission  Medication Sig Dispense Refill  . aspirin 81 MG tablet Take 81 mg by mouth daily.    Marland Kitchen atorvastatin (LIPITOR) 40 MG tablet Take 40 mg by mouth daily.     . Biotin 5000 MCG CAPS Take 5,000 mcg by mouth daily.    . Cholecalciferol (VITAMIN D) 2000 units tablet Take 2,000 Units by mouth daily.     . isosorbide dinitrate (ISORDIL) 5 MG tablet Take 5 mg by mouth daily.    . potassium chloride SA (K-DUR,KLOR-CON) 20 MEQ tablet Take 20 mEq by mouth daily.    Marland Kitchen tiZANidine (  ZANAFLEX) 4 MG tablet Take 2-4 mg by mouth every 8 (eight) hours as needed for muscle spasms.     Marland Kitchen triamterene-hydrochlorothiazide (MAXZIDE-25) 37.5-25 MG tablet Take 1 tablet by mouth daily.      Home: Home Living Family/patient expects to be discharged to:: Private residence Living Arrangements: Alone Available Help at Discharge: Friend(s), Family, Available PRN/intermittently (Sister and son live 3 and 2 hours away respectively) Type of Home: Mobile home Home Access: Stairs to enter Technical brewer of Steps: 4 Entrance Stairs-Rails: Right, Left Home Layout: One  level Bathroom Shower/Tub: Chiropodist: Standard Home Equipment: Environmental consultant - 2 wheels, Environmental consultant - 4 wheels, Bedside commode, Shower seat  Functional History: Prior Function Level of Independence: Independent, Independent with assistive device(s) Comments: Independent prior to decline in LE strength. Using rollator for gait.  Driving until 3 weeks pta. Functional Status:  Mobility: Bed Mobility Overal bed mobility: Needs Assistance Bed Mobility: Supine to Sit, Sit to Supine Supine to sit: Supervision Sit to supine: Supervision General bed mobility comments: Assist for safety only.  Use of rail. Transfers Overall transfer level: Needs assistance Equipment used: Rolling walker (2 wheeled) Transfers: Sit to/from Stand Sit to Stand: Min assist, From elevated surface, Mod assist General transfer comment: Cues to scoot to EOB.  Min assist to rise to standing and for balance from higher bed.  In stance, had patient perform shallow squat x2 with assist for balance.  Mod assist to rise from lower chair. Ambulation/Gait Ambulation/Gait assistance: Mod assist Ambulation Distance (Feet): 8 Feet (8' x2 with sitting rest break between) Assistive device: Rolling walker (2 wheeled) Gait Pattern/deviations: Step-through pattern, Decreased step length - right, Decreased step length - left, Decreased stride length, Shuffle, Trunk flexed General Gait Details: Verbal cues for safe use of RW.  Assist for balance and safety.  At 8', patient reports knees are "giving out" and buckling.  Assist to chair.  After rest, mod assist to move sit to stand from chair.  Ambulated 8' with RW and returned to bed.  LE's fatigued quickly. Gait velocity: decreased Gait velocity interpretation: Below normal speed for age/gender    ADL:    Cognition: Cognition Overall Cognitive Status: Within Functional Limits for tasks assessed Cognition Arousal/Alertness: Awake/alert Behavior During Therapy: WFL for  tasks assessed/performed Overall Cognitive Status: Within Functional Limits for tasks assessed   Blood pressure (!) 96/56, pulse 70, temperature 97.8 F (36.6 C), temperature source Oral, resp. rate 17, height '5\' 2"'$  (1.575 m), weight 58.7 kg (129 lb 6.6 oz), SpO2 97 %. Physical Exam  Nursing note and vitals reviewed. Constitutional: She is oriented to person, place, and time. She appears well-developed and well-nourished. No distress. Nasal cannula in place.  Increase WOB with conversation.   HENT:  Head: Normocephalic and atraumatic.  Mouth/Throat: Oropharynx is clear and moist.  Eyes: Conjunctivae and EOM are normal. Pupils are equal, round, and reactive to light.  Neck: Normal range of motion. Neck supple.  Cardiovascular: Normal rate and regular rhythm.   Respiratory: Effort normal. No stridor. She has wheezes (audible with conversation. ). She exhibits no tenderness.  +[place]  GI: Soft. Bowel sounds are normal. She exhibits no distension. There is no tenderness.  Musculoskeletal: She exhibits edema. She exhibits no tenderness.  Arthritic changes bilateral hands  Neurological: She is alert and oriented to person, place, and time.  Speech clear.  Able to follow basic commands without difficulty.  Sensation intact to light touch Motor: B/l UE: 4+/5 proximal to distal B/l  LE: hip flexion 3+/5, knee extension 4/5, ankle dorsi/plantar flexion 4+/5 Sensation intact to light touch  Skin: Skin is warm and dry. She is not diaphoretic.  Diffuse bruising on bilateral shins with paper thin skin.  Chronic vascular changes LE  Psychiatric: She has a normal mood and affect. Her behavior is normal. Thought content normal.    Results for orders placed or performed during the hospital encounter of 07/06/16 (from the past 24 hour(s))  Basic metabolic panel     Status: Abnormal   Collection Time: 07/09/16  6:06 AM  Result Value Ref Range   Sodium 137 135 - 145 mmol/L   Potassium 3.6 3.5 - 5.1  mmol/L   Chloride 108 101 - 111 mmol/L   CO2 24 22 - 32 mmol/L   Glucose, Bld 108 (H) 65 - 99 mg/dL   BUN 12 6 - 20 mg/dL   Creatinine, Ser 1.11 (H) 0.44 - 1.00 mg/dL   Calcium 8.0 (L) 8.9 - 10.3 mg/dL   GFR calc non Af Amer 47 (L) >60 mL/min   GFR calc Af Amer 54 (L) >60 mL/min   Anion gap 5 5 - 15   No results found.  Assessment/Plan: Diagnosis: Debility Labs and images independently reviewed.  Records reviewed and summated above.  1. Does the need for close, 24 hr/day medical supervision in concert with the patient's rehab needs make it unreasonable for this patient to be served in a less intensive setting? Yes  2. Co-Morbidities requiring supervision/potential complications: chronic LBP (Biofeedback training with therapies to help reduce reliance on opiate pain medications, monitor pain control during therapies, and sedation at rest and titrate to maximum efficacy to ensure participation and gains in therapies), COPD (monitor RR with increased activity), lung cancer s/p RUL and LLL lobectomy, advanced cervical arthropathy with cervical stenosis, hypotension (ensure appropriate fluid status), hyponatremia (cont to monitor, treat if necessary), hypoalbuminemia (maximize nutrition for overall health and wound healing), anemia of chronic disease (transfuse if necessary to ensure appropriate perfusion for increased activity tolerance), CKD (avoid nephrotoxic meds)  3. Due to safety, skin/wound care, disease management and patient education, does the patient require 24 hr/day rehab nursing? Yes 4. Does the patient require coordinated care of a physician, rehab nurse, PT (1-2 hrs/day, 5 days/week) and OT (1-2 hrs/day, 5 days/week) to address physical and functional deficits in the context of the above medical diagnosis(es)? Yes Addressing deficits in the following areas: balance, endurance, locomotion, strength, transferring, bathing, dressing, cognition and psychosocial support 5. Can the patient  actively participate in an intensive therapy program of at least 3 hrs of therapy per day at least 5 days per week? Potentially 6. The potential for patient to make measurable gains while on inpatient rehab is excellent 7. Anticipated functional outcomes upon discharge from inpatient rehab are supervision and min assist  with PT, supervision and min assist with OT, n/a with SLP. 8. Estimated rehab length of stay to reach the above functional goals is: 16-19 days. 9. Does the patient have adequate social supports and living environment to accommodate these discharge functional goals? No 10. Anticipated D/C setting: Other 11. Anticipated post D/C treatments: SNF 12. Overall Rehab/Functional Prognosis: good and fair  RECOMMENDATIONS: This patient's condition is appropriate for continued rehabilitative care in the following setting: Unfortunately, pt's symptoms although exacerbated, appear to be chronic.  She was already having functional decline and it is unlikely she would be able to return to an Mod I level of funtioning after a short  CIR stay.  In additiona to ?ability to tolerate 3 hours therapy/day. Pt would benefit more from a longer rehab course.  Recommend SNF. Patient has agreed to participate in recommended program. Potentially Note that insurance prior authorization may be required for reimbursement for recommended care.  Comment: Rehab Admissions Coordinator to follow up.  Delice Lesch, MD, Mellody Drown 07/09/2016

## 2016-07-09 NOTE — Clinical Social Work Note (Signed)
Clinical Social Work Assessment  Patient Details  Name: [name] MRN: [id number] Date of Birth: [birth year]  Date of referral:  07/09/16               Reason for consult:  Facility Placement                Permission sought to share information with:  Family Supports Permission granted to share information::  Yes, Verbal Permission Granted  Name::     [name]  Agency::     Relationship::  Son  Contact Information:  [phone]  Housing/Transportation Living arrangements for the past 2 months:  Single Family Home Source of Information:  Patient Patient Interpreter Needed:  None Criminal Activity/Legal Involvement Pertinent to Current Situation/Hospitalization:  No - Comment as needed Significant Relationships:  Adult Children, Other Family Members Lives with:  Self Do you feel safe going back to the place where you live?  Yes (Patient feels safe at home, but expressed understanding that ST rehab will be benefical to her before returning home.) Need for family participation in patient care:  No (Coment)  Care giving concerns:  Patient reported that she lives alone and talked with CSW about her husband who died 4 years ago. [name] expressed understanding that she ST rehab will help her be stronger and safer at home.   Social Worker assessment / plan:  CSW talked with patient at the bedside regarding discharge planning and recommendation of ST rehab. [name] was sitting up in a chair at the bedside and was expecting and very open to talking with CSW about her discharge disposition. Patient talked with CSW about her family and indicated that her son and 2 grandchildren live in [place], as well as her sister, brother and sister-in-law. [name] also talked with CSW about growing up in [place] and her 2 marriages. CSW and patient talked about rehab in a skilled facility and patient reported that she has never been to a facility for rehab before, and this was  discussed. The facility search process was discussed and patient was provided with a SNF list for [facility]. Patient provided CSW with her preferences.  Employment status:  Retired Science writer) PT Recommendations:  Da[place] / Referral to community resources:  Other (Comment Required) (Patient provided with skilled facility list for [facility])  Patient/Family's Response to care:  [name] did not express any concerns regarding her care during this hospitalization.  Patient/Family's Understanding of and Emotional Response to Diagnosis, Current Treatment, and Prognosis: Not discussed.  Emotional Assessment Appearance:  Appears stated age Attitude/Demeanor/Rapport:  Other (Appropriate) Affect (typically observed):  Accepting, Pleasant, Appropriate Orientation:  Oriented to Self, Oriented to Place, Oriented to  Time, Oriented to Situation Alcohol / Substance use:  Never Used, Tobacco Use, Alcohol Use (Patient reported that she quit smoking 16 years ago, drinks aporox. 8.4 oz of alcohol per week and does not use illicit drugs.) Psych involvement (Current and /or in the community):  No (Comment)  Discharge Needs  Concerns to be addressed:  Discharge Planning Concerns Readmission within the last 30 days:  No Current discharge risk:  None Barriers to Discharge:  No Barriers Identified   Sable Feil, LCSW 07/09/2016, 5:06 PM

## 2016-07-09 NOTE — Consult Note (Signed)
[facility] Nurse wound consult note Reason for Consult: Consult requested for left leg wound.  Pt has a full thickness skin tear to left calf. 2X.2X.1cm Wound bed: dry red wound bed Drainage (amount, consistency, odor) No odor or drainage Periwound: Intact skin surrounding Dressing procedure/placement/frequency: Antibiotic ointment has already been ordered.  Foam dressing to promote healing. Please re-consult if further assistance is needed.  Thank-you,  Julien Girt MSN, [place], [place], [place], [place]

## 2016-07-09 NOTE — Progress Notes (Signed)
Report called to Mardene Celeste at [facility].   All questions answered.

## 2016-07-09 NOTE — NC FL2 (Signed)
[place] LEVEL OF CARE SCREENING TOOL     IDENTIFICATION  Patient Name: [name] Birthdate: 1941-05-29 Sex: female Admission Date (Current Location): 07/06/2016  [facility] and [place] Number:  Herbalist and Address:  [facility] [place], [place] [address] [place], [place] [address]      Provider Number: [id number]  Attending Physician Name and Address:  Shon Millet*  Relative Name and Phone Number:  Cathi Roan; [phone]     Current Level of Care: Hospital Recommended Level of Care: [place] Prior Approval Number:    Date Approved/Denied:   PASRR Number: [id number] (07/09/16)  Discharge Plan: SNF    Current Diagnoses: Patient Active Problem List   Diagnosis Date Noted  . Chronic obstructive pulmonary disease ([place])   . History of lung cancer   . Spinal stenosis in cervical region   . Hypoalbuminemia due to protein-calorie malnutrition ([place])   . Anemia of chronic disease   . Debility   . Dehydration 07/07/2016  . Decreased oral intake 07/07/2016  . Hypotension 07/07/2016  . CKD (chronic kidney disease) stage 3, GFR 30-59 ml/min 07/07/2016  . HLD (hyperlipidemia) 07/07/2016  . Skin breakdown 07/07/2016  . Weakness of both lower extremities 07/06/2016  . Hyponatremia 07/06/2016  . COPD (chronic obstructive pulmonary disease) ([place]) 07/06/2016  . Chronic back pain 07/06/2016  . Hyperbilirubinemia 07/06/2016  . Malignant neoplasm of bronchus and lung, unspecified site 01/19/2013    Orientation RESPIRATION BLADDER Height & Weight     Self, Time, Situation, Place  Normal Continent Weight: 129 lb 6.6 oz (58.7 kg) Height:  '5\' 2"'$  (157.5 cm)  BEHAVIORAL SYMPTOMS/MOOD NEUROLOGICAL BOWEL NUTRITION STATUS      Continent Diet (Regular)  AMBULATORY STATUS COMMUNICATION OF NEEDS Skin   Extensive Assist Verbally Skin abrasions (Skin tear, left calf. Foam dressing)                        Personal Care Assistance Level of Assistance  Bathing, Feeding, Dressing Bathing Assistance: Limited assistance Feeding assistance: Independent Dressing Assistance: Limited assistance     Functional Limitations Info  Sight, Hearing, Speech Sight Info: Adequate Hearing Info: Adequate Speech Info: Adequate    SPECIAL CARE FACTORS FREQUENCY  PT (By licensed PT), OT (By licensed OT)     PT Frequency: Evaluated 10/28 and a minimum of 3X per week therapy recommended OT Frequency: Evaluated 10/30 and a minimum of 2X per week therapy recommended            Contractures Contractures Info: Not present    Additional Factors Info  Code Status, Allergies Code Status Info: Full Allergies Info: sulfa antibiotics           Current Medications (07/09/2016):  This is the current hospital active medication list Current Facility-Administered Medications  Medication Dose Route Frequency Provider Last Rate Last Dose  . 0.9 %  sodium chloride infusion   Intravenous Continuous Shon Millet, DO 125 mL/hr at 07/09/16 1140    . acetaminophen (TYLENOL) tablet 650 mg  650 mg Oral Q6H PRN Elwin Mocha, MD       Or  . acetaminophen (TYLENOL) suppository 650 mg  650 mg Rectal Q6H PRN Elwin Mocha, MD      . albuterol (PROVENTIL) (2.5 MG/3ML) 0.083% nebulizer solution 2.5 mg  2.5 mg Nebulization Q2H PRN Elwin Mocha, MD   2.5 mg at 07/08/16 1609  . atorvastatin (LIPITOR) tablet 40  mg  40 mg Oral q1800 Elwin Mocha, MD   40 mg at 07/08/16 1739  . [START ON 07/10/2016] bacitracin ointment   Topical Daily Rich Fuchs Choi, DO      . docusate sodium (COLACE) capsule 100 mg  100 mg Oral Daily Shon Millet, DO   100 mg at 07/09/16 0940  . enoxaparin (LOVENOX) injection 40 mg  40 mg Subcutaneous Q24H Elwin Mocha, MD   40 mg at 07/08/16 1739  . ondansetron (ZOFRAN-ODT) disintegrating tablet 4 mg  4 mg Oral Q8H PRN Shon Millet, DO      .  polyethylene glycol (MIRALAX / GLYCOLAX) packet 17 g  17 g Oral Daily Rich Fuchs Choi, DO   17 g at 07/09/16 0940  . tiZANidine (ZANAFLEX) tablet 2-4 mg  2-4 mg Oral Q8H PRN Elwin Mocha, MD      . traZODone (DESYREL) tablet 50 mg  50 mg Oral QHS PRN Elwin Mocha, MD   50 mg at 07/06/16 2323     Discharge Medications: Please see discharge summary for a list of discharge medications.  Relevant Imaging Results:  Relevant Lab Results:   Additional Information ss# [id number]  Sable Feil, LCSW

## 2016-07-09 NOTE — Progress Notes (Signed)
Physical Therapy Treatment Patient Details Name: [name] MRN: [id number] DOB: Jan 28, 1941 Today's Date: 07/09/2016    History of Present Illness Patient is a 75 yo female admitted 07/06/16 with BLE weakness, dehydration, hypotension, gait disturbance.   PMH:  chronic back pain, back surgeries, lung CA, COPD, CKD, HLD, LE weakness    PT Comments    Pt improving slowly with mobility.  Placed emphasis on safety with transfers and braking on the rollator.  With the rollator, pt was able to increase distance and keep the device under relative control.  Her sats on the other hand, dropped to 85/86% on RA and EHR in the 90's.  Pt took up to 5 minutes to recover to 90% on RA   Follow Up Recommendations  SNF     Equipment Recommendations   (TBA from SNF)    Recommendations for Other Services       Precautions / Restrictions      Mobility  Bed Mobility               General bed mobility comments: pt in chair  Transfers Overall transfer level: Needs assistance Equipment used: 4-wheeled walker Transfers: Sit to/from Stand Sit to Stand: Min guard         General transfer comment: cues for hand placement from recliner  Ambulation/Gait Ambulation/Gait assistance: Min guard Ambulation Distance (Feet): 35 Feet (x2) Assistive device: 4-wheeled walker Gait Pattern/deviations: Step-through pattern Gait velocity: decreased Gait velocity interpretation: Below normal speed for age/gender General Gait Details: cues for brake safety with rollator.  pt's SpO2 dropped to 84/85% on RA and EHR in th 90's after 35 feet each trial.  Without oxygen, pt needed coaching for efficient breathing and about 5 minutes to get sats up to 90%.  On 1-2 L [place] she increases to 90 significantly faster.   Stairs            Wheelchair Mobility    Modified Rankin (Stroke Patients Only)       Balance Overall balance assessment: Needs assistance Sitting-balance support: No upper  extremity supported Sitting balance-Leahy Scale: Fair     Standing balance support: Bilateral upper extremity supported Standing balance-Leahy Scale: Poor                      Cognition Arousal/Alertness: Awake/alert Behavior During Therapy: WFL for tasks assessed/performed Overall Cognitive Status: Within Functional Limits for tasks assessed                      Exercises      General Comments        Pertinent Vitals/Pain Pain Assessment: No/denies pain    Home Living                      Prior Function            PT Goals (current goals can now be found in the care plan section) Acute Rehab PT Goals Patient Stated Goal: To be able to return home safely PT Goal Formulation: With patient/family Time For Goal Achievement: 07/21/16 Potential to Achieve Goals: Good Progress towards PT goals: Progressing toward goals    Frequency    Min 3X/week      PT Plan Discharge plan needs to be updated    Co-evaluation             End of Session   Activity Tolerance: Patient limited by fatigue Patient left: in chair;with  call bell/phone within reach     Time: 8938-1017 PT Time Calculation (min) (ACUTE ONLY): 29 min  Charges:                       G Codes:      Matej Sappenfield, Tessie Fass 07/09/2016, 2:46 PM 07/09/2016  Donnella Sham, PT [phone]  (pager)

## 2016-07-10 DIAGNOSIS — G8220 Paraplegia, unspecified: Secondary | ICD-10-CM | POA: Diagnosis not present

## 2016-07-10 DIAGNOSIS — Z85118 Personal history of other malignant neoplasm of bronchus and lung: Secondary | ICD-10-CM | POA: Diagnosis not present

## 2016-07-10 DIAGNOSIS — N183 Chronic kidney disease, stage 3 (moderate): Secondary | ICD-10-CM | POA: Diagnosis not present

## 2016-07-10 DIAGNOSIS — J449 Chronic obstructive pulmonary disease, unspecified: Secondary | ICD-10-CM | POA: Diagnosis not present

## 2016-07-10 NOTE — Consult Note (Signed)
            [facility] CM Primary Care Navigator  07/10/2016  [name] May 22, 1941 [id number]   Went to see patient at the bedside for possible discharge needs but she was already discharged yesterday 07/09/16.  Primary care provider's office called [name]) and notified of patient's discharge to skilled nursing facility ([facility]) and need for post hospital follow-up and transition of care once discharged home. Made aware to refer to [facility] care management for care coordination needs if deemed appropriate.         For additional questions please contact:  Edwena Felty A. Arlenis Blaydes, BSN, RN-BC [facility] PRIMARY CARE Navigator Cell: [phone]

## 2016-07-11 DIAGNOSIS — Z85118 Personal history of other malignant neoplasm of bronchus and lung: Secondary | ICD-10-CM | POA: Diagnosis not present

## 2016-07-11 DIAGNOSIS — E860 Dehydration: Secondary | ICD-10-CM | POA: Diagnosis not present

## 2016-07-11 DIAGNOSIS — R600 Localized edema: Secondary | ICD-10-CM | POA: Diagnosis not present

## 2016-07-11 DIAGNOSIS — E784 Other hyperlipidemia: Secondary | ICD-10-CM | POA: Diagnosis not present

## 2016-07-11 DIAGNOSIS — E871 Hypo-osmolality and hyponatremia: Secondary | ICD-10-CM | POA: Diagnosis not present

## 2016-07-11 DIAGNOSIS — N183 Chronic kidney disease, stage 3 (moderate): Secondary | ICD-10-CM | POA: Diagnosis not present

## 2016-07-11 DIAGNOSIS — J449 Chronic obstructive pulmonary disease, unspecified: Secondary | ICD-10-CM | POA: Diagnosis not present

## 2016-07-11 DIAGNOSIS — R531 Weakness: Secondary | ICD-10-CM | POA: Diagnosis not present

## 2016-07-17 DIAGNOSIS — I959 Hypotension, unspecified: Secondary | ICD-10-CM | POA: Diagnosis not present

## 2016-07-17 DIAGNOSIS — N183 Chronic kidney disease, stage 3 (moderate): Secondary | ICD-10-CM | POA: Diagnosis not present

## 2016-07-17 DIAGNOSIS — Z85118 Personal history of other malignant neoplasm of bronchus and lung: Secondary | ICD-10-CM | POA: Diagnosis not present

## 2016-07-17 DIAGNOSIS — J449 Chronic obstructive pulmonary disease, unspecified: Secondary | ICD-10-CM | POA: Diagnosis not present

## 2016-07-18 ENCOUNTER — Other Ambulatory Visit: Payer: Self-pay | Admitting: *Deleted

## 2016-07-18 NOTE — Patient Outreach (Signed)
Met with patient at bedside, discussed [facility] care management program. Patient denies any post discharge needs at this time.  RNCM will sign off, but will remain available for additional [facility] program services consult needs arise. [name]. Laymond Purser, RN, BSN, [place] Post-Acute Care Coordinator [phone]

## 2016-07-23 DIAGNOSIS — N183 Chronic kidney disease, stage 3 (moderate): Secondary | ICD-10-CM | POA: Diagnosis not present

## 2016-07-23 DIAGNOSIS — J449 Chronic obstructive pulmonary disease, unspecified: Secondary | ICD-10-CM | POA: Diagnosis not present

## 2016-07-23 DIAGNOSIS — M1990 Unspecified osteoarthritis, unspecified site: Secondary | ICD-10-CM | POA: Diagnosis not present

## 2016-07-23 DIAGNOSIS — D649 Anemia, unspecified: Secondary | ICD-10-CM | POA: Diagnosis not present

## 2016-07-31 DIAGNOSIS — R29898 Other symptoms and signs involving the musculoskeletal system: Secondary | ICD-10-CM | POA: Diagnosis not present

## 2016-07-31 DIAGNOSIS — G8929 Other chronic pain: Secondary | ICD-10-CM | POA: Diagnosis not present

## 2016-07-31 DIAGNOSIS — J449 Chronic obstructive pulmonary disease, unspecified: Secondary | ICD-10-CM | POA: Diagnosis not present

## 2016-07-31 DIAGNOSIS — N183 Chronic kidney disease, stage 3 (moderate): Secondary | ICD-10-CM | POA: Diagnosis not present

## 2016-07-31 DIAGNOSIS — D631 Anemia in chronic kidney disease: Secondary | ICD-10-CM | POA: Diagnosis not present

## 2016-07-31 DIAGNOSIS — M6281 Muscle weakness (generalized): Secondary | ICD-10-CM | POA: Diagnosis not present

## 2016-07-31 DIAGNOSIS — E785 Hyperlipidemia, unspecified: Secondary | ICD-10-CM | POA: Diagnosis not present

## 2016-07-31 DIAGNOSIS — E440 Moderate protein-calorie malnutrition: Secondary | ICD-10-CM | POA: Diagnosis not present

## 2016-07-31 DIAGNOSIS — M4802 Spinal stenosis, cervical region: Secondary | ICD-10-CM | POA: Diagnosis not present

## 2016-08-01 DIAGNOSIS — R29898 Other symptoms and signs involving the musculoskeletal system: Secondary | ICD-10-CM | POA: Diagnosis not present

## 2016-08-01 DIAGNOSIS — G8929 Other chronic pain: Secondary | ICD-10-CM | POA: Diagnosis not present

## 2016-08-01 DIAGNOSIS — M6281 Muscle weakness (generalized): Secondary | ICD-10-CM | POA: Diagnosis not present

## 2016-08-01 DIAGNOSIS — D631 Anemia in chronic kidney disease: Secondary | ICD-10-CM | POA: Diagnosis not present

## 2016-08-01 DIAGNOSIS — E785 Hyperlipidemia, unspecified: Secondary | ICD-10-CM | POA: Diagnosis not present

## 2016-08-01 DIAGNOSIS — N183 Chronic kidney disease, stage 3 (moderate): Secondary | ICD-10-CM | POA: Diagnosis not present

## 2016-08-01 DIAGNOSIS — J449 Chronic obstructive pulmonary disease, unspecified: Secondary | ICD-10-CM | POA: Diagnosis not present

## 2016-08-01 DIAGNOSIS — M4802 Spinal stenosis, cervical region: Secondary | ICD-10-CM | POA: Diagnosis not present

## 2016-08-01 DIAGNOSIS — E440 Moderate protein-calorie malnutrition: Secondary | ICD-10-CM | POA: Diagnosis not present

## 2016-08-01 DIAGNOSIS — D649 Anemia, unspecified: Secondary | ICD-10-CM | POA: Diagnosis not present

## 2016-08-01 DIAGNOSIS — Z85118 Personal history of other malignant neoplasm of bronchus and lung: Secondary | ICD-10-CM | POA: Diagnosis not present

## 2016-08-02 DIAGNOSIS — G8929 Other chronic pain: Secondary | ICD-10-CM | POA: Diagnosis not present

## 2016-08-02 DIAGNOSIS — N183 Chronic kidney disease, stage 3 (moderate): Secondary | ICD-10-CM | POA: Diagnosis not present

## 2016-08-02 DIAGNOSIS — R29898 Other symptoms and signs involving the musculoskeletal system: Secondary | ICD-10-CM | POA: Diagnosis not present

## 2016-08-02 DIAGNOSIS — D631 Anemia in chronic kidney disease: Secondary | ICD-10-CM | POA: Diagnosis not present

## 2016-08-02 DIAGNOSIS — M6281 Muscle weakness (generalized): Secondary | ICD-10-CM | POA: Diagnosis not present

## 2016-08-02 DIAGNOSIS — E785 Hyperlipidemia, unspecified: Secondary | ICD-10-CM | POA: Diagnosis not present

## 2016-08-02 DIAGNOSIS — J449 Chronic obstructive pulmonary disease, unspecified: Secondary | ICD-10-CM | POA: Diagnosis not present

## 2016-08-02 DIAGNOSIS — M4802 Spinal stenosis, cervical region: Secondary | ICD-10-CM | POA: Diagnosis not present

## 2016-08-02 DIAGNOSIS — E440 Moderate protein-calorie malnutrition: Secondary | ICD-10-CM | POA: Diagnosis not present

## 2016-08-03 DIAGNOSIS — M4802 Spinal stenosis, cervical region: Secondary | ICD-10-CM | POA: Diagnosis not present

## 2016-08-03 DIAGNOSIS — D631 Anemia in chronic kidney disease: Secondary | ICD-10-CM | POA: Diagnosis not present

## 2016-08-03 DIAGNOSIS — R29898 Other symptoms and signs involving the musculoskeletal system: Secondary | ICD-10-CM | POA: Diagnosis not present

## 2016-08-03 DIAGNOSIS — G8929 Other chronic pain: Secondary | ICD-10-CM | POA: Diagnosis not present

## 2016-08-03 DIAGNOSIS — E785 Hyperlipidemia, unspecified: Secondary | ICD-10-CM | POA: Diagnosis not present

## 2016-08-03 DIAGNOSIS — E440 Moderate protein-calorie malnutrition: Secondary | ICD-10-CM | POA: Diagnosis not present

## 2016-08-03 DIAGNOSIS — M6281 Muscle weakness (generalized): Secondary | ICD-10-CM | POA: Diagnosis not present

## 2016-08-03 DIAGNOSIS — J449 Chronic obstructive pulmonary disease, unspecified: Secondary | ICD-10-CM | POA: Diagnosis not present

## 2016-08-03 DIAGNOSIS — N183 Chronic kidney disease, stage 3 (moderate): Secondary | ICD-10-CM | POA: Diagnosis not present

## 2016-08-04 DIAGNOSIS — E440 Moderate protein-calorie malnutrition: Secondary | ICD-10-CM | POA: Diagnosis not present

## 2016-08-04 DIAGNOSIS — R29898 Other symptoms and signs involving the musculoskeletal system: Secondary | ICD-10-CM | POA: Diagnosis not present

## 2016-08-04 DIAGNOSIS — E785 Hyperlipidemia, unspecified: Secondary | ICD-10-CM | POA: Diagnosis not present

## 2016-08-04 DIAGNOSIS — J449 Chronic obstructive pulmonary disease, unspecified: Secondary | ICD-10-CM | POA: Diagnosis not present

## 2016-08-04 DIAGNOSIS — D649 Anemia, unspecified: Secondary | ICD-10-CM | POA: Diagnosis not present

## 2016-08-04 DIAGNOSIS — M4802 Spinal stenosis, cervical region: Secondary | ICD-10-CM | POA: Diagnosis not present

## 2016-08-04 DIAGNOSIS — D631 Anemia in chronic kidney disease: Secondary | ICD-10-CM | POA: Diagnosis not present

## 2016-08-04 DIAGNOSIS — M6281 Muscle weakness (generalized): Secondary | ICD-10-CM | POA: Diagnosis not present

## 2016-08-04 DIAGNOSIS — N183 Chronic kidney disease, stage 3 (moderate): Secondary | ICD-10-CM | POA: Diagnosis not present

## 2016-08-04 DIAGNOSIS — G8929 Other chronic pain: Secondary | ICD-10-CM | POA: Diagnosis not present

## 2016-08-05 DIAGNOSIS — J449 Chronic obstructive pulmonary disease, unspecified: Secondary | ICD-10-CM | POA: Diagnosis not present

## 2016-08-05 DIAGNOSIS — E440 Moderate protein-calorie malnutrition: Secondary | ICD-10-CM | POA: Diagnosis not present

## 2016-08-05 DIAGNOSIS — N183 Chronic kidney disease, stage 3 (moderate): Secondary | ICD-10-CM | POA: Diagnosis not present

## 2016-08-05 DIAGNOSIS — E785 Hyperlipidemia, unspecified: Secondary | ICD-10-CM | POA: Diagnosis not present

## 2016-08-05 DIAGNOSIS — R29898 Other symptoms and signs involving the musculoskeletal system: Secondary | ICD-10-CM | POA: Diagnosis not present

## 2016-08-05 DIAGNOSIS — G8929 Other chronic pain: Secondary | ICD-10-CM | POA: Diagnosis not present

## 2016-08-05 DIAGNOSIS — D631 Anemia in chronic kidney disease: Secondary | ICD-10-CM | POA: Diagnosis not present

## 2016-08-05 DIAGNOSIS — M6281 Muscle weakness (generalized): Secondary | ICD-10-CM | POA: Diagnosis not present

## 2016-08-05 DIAGNOSIS — M4802 Spinal stenosis, cervical region: Secondary | ICD-10-CM | POA: Diagnosis not present

## 2016-08-06 DIAGNOSIS — M4802 Spinal stenosis, cervical region: Secondary | ICD-10-CM | POA: Diagnosis not present

## 2016-08-06 DIAGNOSIS — N183 Chronic kidney disease, stage 3 (moderate): Secondary | ICD-10-CM | POA: Diagnosis not present

## 2016-08-06 DIAGNOSIS — E785 Hyperlipidemia, unspecified: Secondary | ICD-10-CM | POA: Diagnosis not present

## 2016-08-06 DIAGNOSIS — M6281 Muscle weakness (generalized): Secondary | ICD-10-CM | POA: Diagnosis not present

## 2016-08-06 DIAGNOSIS — E440 Moderate protein-calorie malnutrition: Secondary | ICD-10-CM | POA: Diagnosis not present

## 2016-08-06 DIAGNOSIS — J449 Chronic obstructive pulmonary disease, unspecified: Secondary | ICD-10-CM | POA: Diagnosis not present

## 2016-08-06 DIAGNOSIS — G8929 Other chronic pain: Secondary | ICD-10-CM | POA: Diagnosis not present

## 2016-08-06 DIAGNOSIS — D631 Anemia in chronic kidney disease: Secondary | ICD-10-CM | POA: Diagnosis not present

## 2016-08-06 DIAGNOSIS — R29898 Other symptoms and signs involving the musculoskeletal system: Secondary | ICD-10-CM | POA: Diagnosis not present

## 2016-08-09 DIAGNOSIS — M4802 Spinal stenosis, cervical region: Secondary | ICD-10-CM | POA: Diagnosis not present

## 2016-08-09 DIAGNOSIS — M549 Dorsalgia, unspecified: Secondary | ICD-10-CM | POA: Diagnosis not present

## 2016-08-09 DIAGNOSIS — J449 Chronic obstructive pulmonary disease, unspecified: Secondary | ICD-10-CM | POA: Diagnosis not present

## 2016-08-09 DIAGNOSIS — E785 Hyperlipidemia, unspecified: Secondary | ICD-10-CM | POA: Diagnosis not present

## 2016-11-08 DEATH — deceased

## 2018-01-28 IMAGING — MR MR CERVICAL SPINE W/O CM
4 of 5 series · 21 of 48 positions shown · non-contrast
Comparison: CT cervical spine 10/27/2011.

CLINICAL DATA: Bilateral hand numbness.  Weakness in both legs

EXAM:
MRI CERVICAL SPINE WITHOUT CONTRAST
TECHNIQUE: Multiplanar, multisequence MR imaging of the cervical spine was
performed. No intravenous contrast was administered.

[Series 2: T2 · sagittal · 3.0mm · 0.41mm/px · 7 of 13 slices shown (1 of 3)]
[im 1/13]
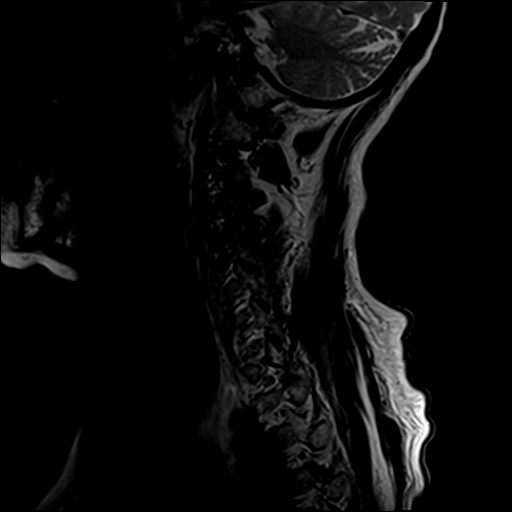
[im 3/13]
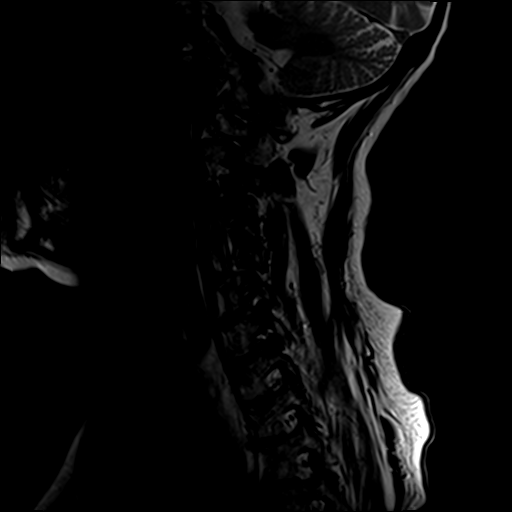
[im 5/13]
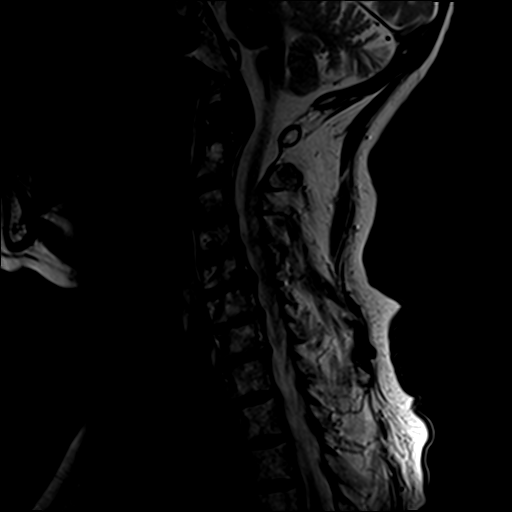
[im 7/13]
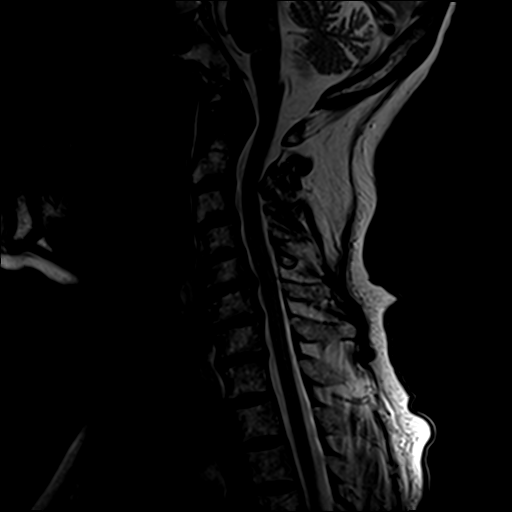
[im 9/13]
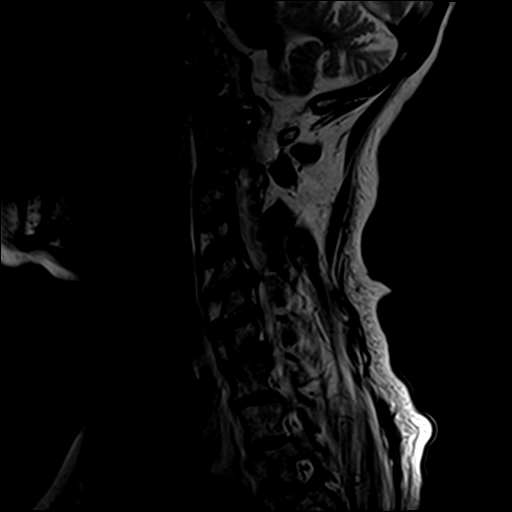
[im 11/13]
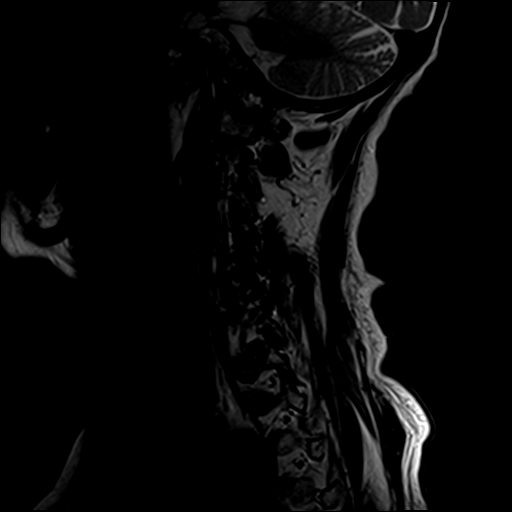
[im 13/13]
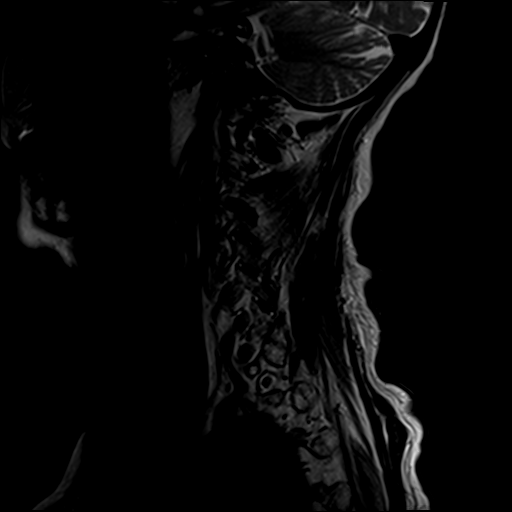

[Series 3: T1 · sagittal · 3.0mm · 0.41mm/px · 3 of 13 slices shown]
[im 3/13]
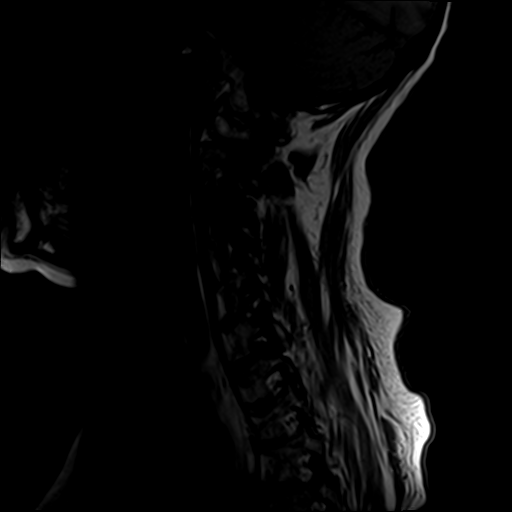
[im 7/13]
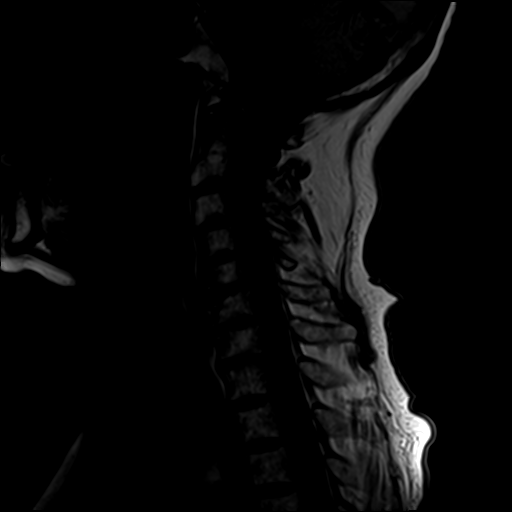
[im 11/13]
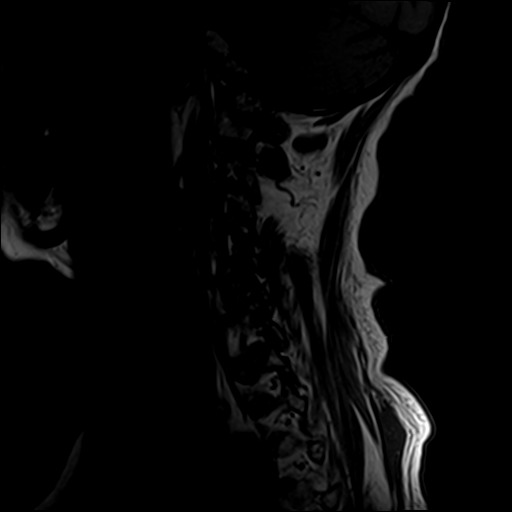

[Series 5: T2 · axial · 3.0mm · 0.39mm/px · z∈[-30,+61]mm · 8 of 26 slices shown (2 of 3)]
[im 1/26]
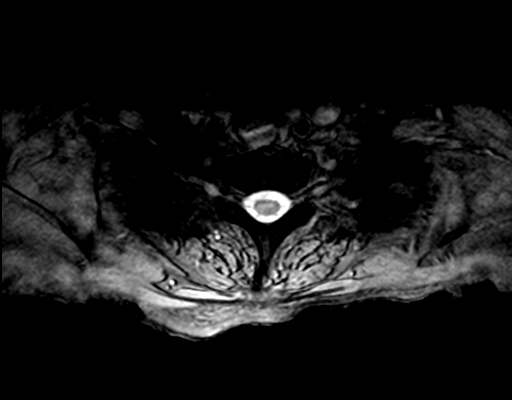
[im 4/26]
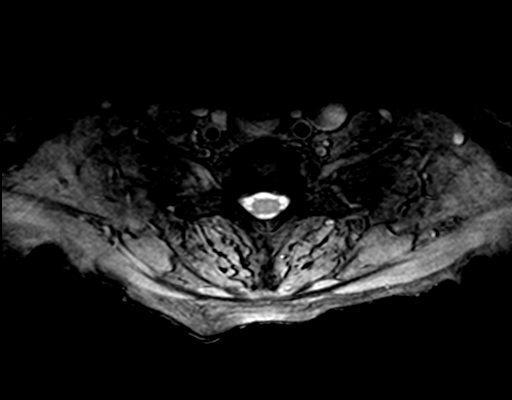
[im 8/26]
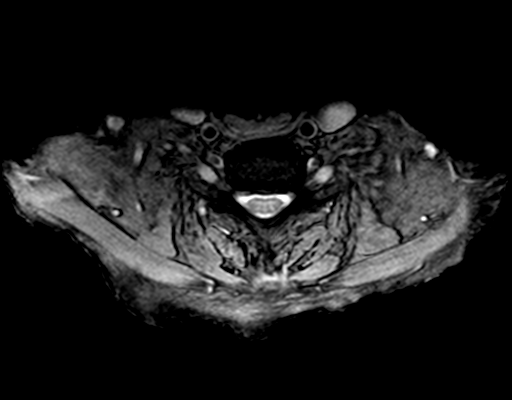
[im 12/26]
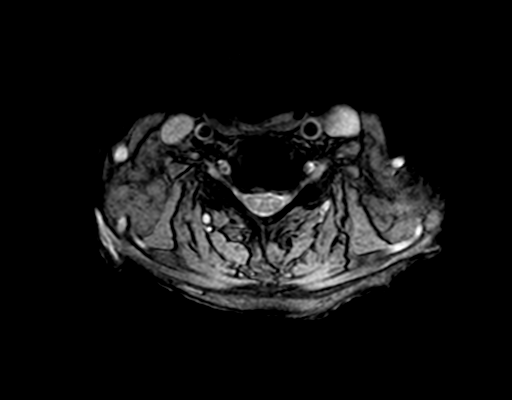
[im 14/26]
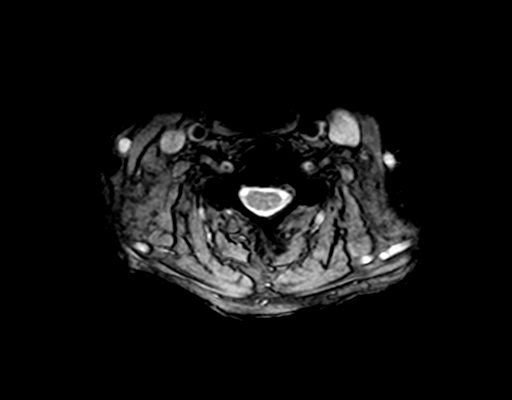
[im 18/26]
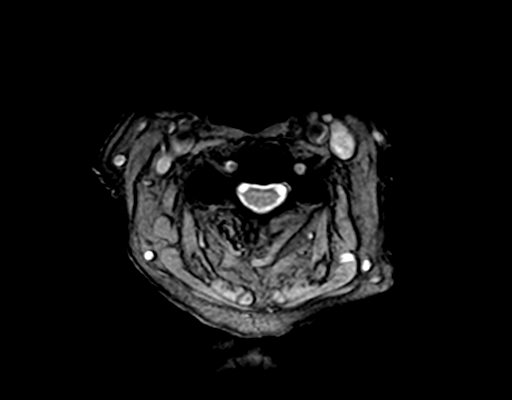
[im 22/26]
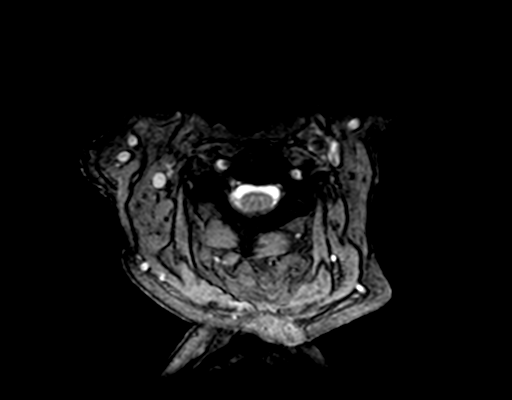
[im 26/26]
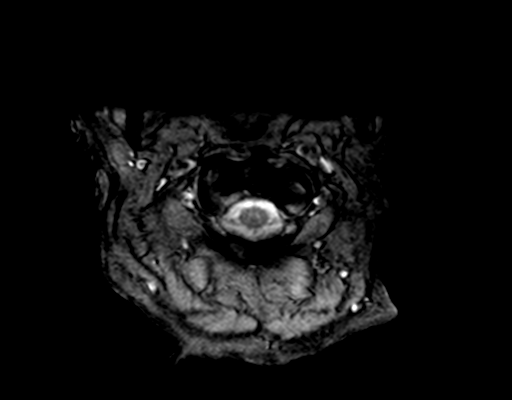

[Series 6: T2 · axial · 3.0mm · 0.39mm/px · z∈[-16,+46]mm · 3 of 25 slices shown (3 of 3)]
[im 5/25]
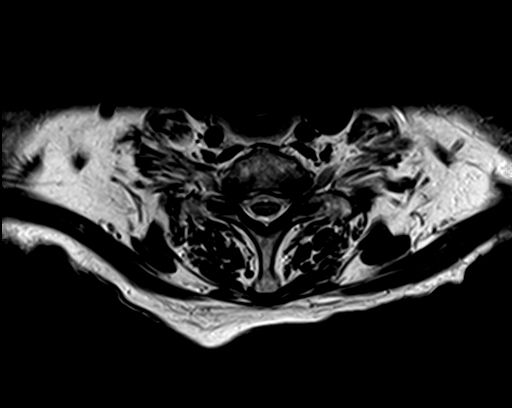
[im 13/25]
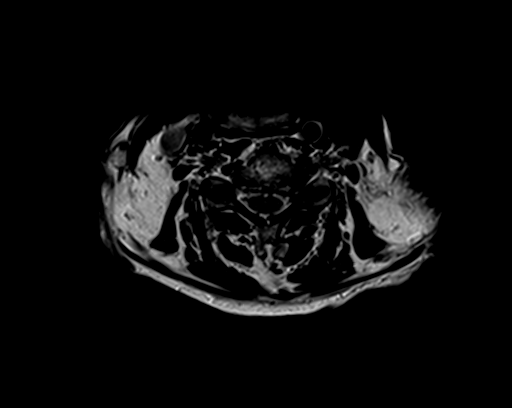
[im 21/25]
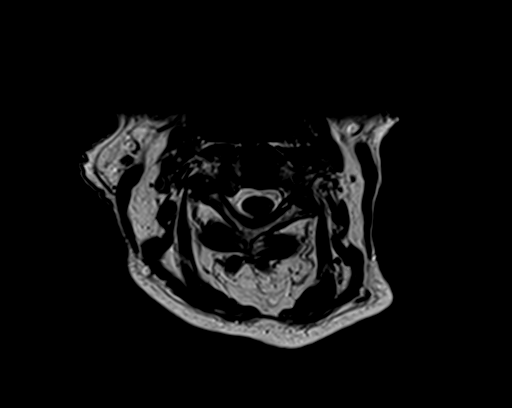

[21 of 48 positions shown; findings below may reference images not displayed]

FINDINGS: Alignment: Slight C3-4 and C4-5 anterolisthesis, facet mediated.

Vertebrae: Heterogeneous marrow without fracture, evidence of
discitis, or focal bone lesion.

Cord: Normal signal and morphology.

Posterior Fossa, vertebral arteries, paraspinal tissues: Patchy T2
hyperintensity in the pons, usually chronic microvascular disease.

Disc levels:

C2-3: Disc narrowing and right uncovertebral ridging. Facet
arthropathy with spurring greater on the right. Moderate range right
foraminal stenosis. Patent canal and left foramen.

C3-4: Facet arthropathy with bulky spurring on the right. Slight
anterolisthesis. Mild annulus bulging. At least moderate right
foraminal stenosis.

C4-5: Facet arthropathy with bulky spurring on the right. Slight
anterolisthesis. Mild disc narrowing and bulging. Patent canal and
bilateral foramina.

C5-6: Posterior disc osteophyte complex with left larger than right
uncovertebral ridging. Negative facets. Advanced left and moderate
right foraminal stenosis. Ventral subarachnoid space effacement.

C6-7: Disc narrowing with bilateral uncovertebral ridging. Negative
facets. Moderate left more than right foraminal stenosis.

C7-T1:Disc narrowing with left foraminal protrusion on sagittal
acquisition. Endplate ridging at the right foramen. Moderate right
and advanced left foraminal stenosis.
IMPRESSION: 1. Advanced upper cervical facet arthropathy and lower cervical disc
degeneration.
2. Patent canal.  No evidence of myelopathy.
3. C2-3 and C3-4 moderate right foraminal stenosis.
4. C5-6 bilateral foraminal stenosis with C6 impingement greater on
the left.
5. C6-7 moderate bilateral foraminal stenosis, worse on the left.
6. C7-T1 biforaminal stenosis with C8 impingement greater on the
left.
